# Patient Record
Sex: Female | Born: 1990 | Race: Black or African American | Hispanic: No | Marital: Single | Smoking: Never smoker
Health system: Southern US, Community
[De-identification: ages and names within clinical notes are randomized; demographics above are authoritative.]

## PROBLEM LIST (undated history)

## (undated) HISTORY — PX: HERNIA REPAIR: SHX51

## (undated) HISTORY — PX: OTHER SURGICAL HISTORY: SHX169

---
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
## 2010-02-10 ENCOUNTER — Inpatient Hospital Stay (HOSPITAL_COMMUNITY): Admission: AD | Admit: 2010-02-10 | Discharge: 2010-02-11 | Payer: Self-pay | Admitting: Obstetrics & Gynecology

## 2010-02-12 ENCOUNTER — Ambulatory Visit: Payer: Self-pay | Admitting: Nurse Practitioner

## 2010-02-12 ENCOUNTER — Inpatient Hospital Stay (HOSPITAL_COMMUNITY): Admission: AD | Admit: 2010-02-12 | Discharge: 2010-02-12 | Payer: Self-pay | Admitting: Family Medicine

## 2010-02-15 ENCOUNTER — Ambulatory Visit: Payer: Self-pay | Admitting: Obstetrics and Gynecology

## 2010-02-15 ENCOUNTER — Inpatient Hospital Stay (HOSPITAL_COMMUNITY): Admission: AD | Admit: 2010-02-15 | Discharge: 2010-02-15 | Payer: Self-pay | Admitting: Obstetrics & Gynecology

## 2010-08-19 LAB — CBC
HCT: 30.9 % — ABNORMAL LOW (ref 36.0–46.0)
MCHC: 33.8 g/dL (ref 30.0–36.0)
MCV: 83.2 fL (ref 78.0–100.0)
MCV: 83.4 fL (ref 78.0–100.0)
Platelets: 277 10*3/uL (ref 150–400)
Platelets: 306 10*3/uL (ref 150–400)
RBC: 3.17 MIL/uL — ABNORMAL LOW (ref 3.87–5.11)
RDW: 14.1 % (ref 11.5–15.5)
RDW: 14.5 % (ref 11.5–15.5)
WBC: 13.7 10*3/uL — ABNORMAL HIGH (ref 4.0–10.5)

## 2010-08-19 LAB — HCG, QUANTITATIVE, PREGNANCY
hCG, Beta Chain, Quant, S: 5147 m[IU]/mL — ABNORMAL HIGH (ref <5)
hCG, Beta Chain, Quant, S: 979 m[IU]/mL — ABNORMAL HIGH (ref <5)

## 2010-08-19 LAB — RH IMMUNE GLOB WKUP(>/=20WKS)(NOT WOMEN'S HOSP)

## 2011-02-07 ENCOUNTER — Emergency Department (HOSPITAL_COMMUNITY)
Admission: EM | Admit: 2011-02-07 | Discharge: 2011-02-08 | Disposition: A | Discharge status: Home / Self Care | Payer: Self-pay | Attending: Emergency Medicine | Admitting: Emergency Medicine

## 2011-02-07 DIAGNOSIS — J020 Streptococcal pharyngitis: Secondary | ICD-10-CM | POA: Insufficient documentation

## 2011-02-07 DIAGNOSIS — H7290 Unspecified perforation of tympanic membrane, unspecified ear: Secondary | ICD-10-CM | POA: Insufficient documentation

## 2011-02-07 DIAGNOSIS — H9209 Otalgia, unspecified ear: Secondary | ICD-10-CM | POA: Insufficient documentation

## 2011-02-07 DIAGNOSIS — I10 Essential (primary) hypertension: Secondary | ICD-10-CM | POA: Insufficient documentation

## 2012-09-15 ENCOUNTER — Emergency Department (HOSPITAL_COMMUNITY)
Admission: EM | Admit: 2012-09-15 | Discharge: 2012-09-15 | Disposition: A | Discharge status: Home / Self Care | Payer: Self-pay | Attending: Emergency Medicine | Admitting: Emergency Medicine

## 2012-09-15 ENCOUNTER — Encounter (HOSPITAL_COMMUNITY): Payer: Self-pay | Admitting: *Deleted

## 2012-09-15 DIAGNOSIS — Z87891 Personal history of nicotine dependence: Secondary | ICD-10-CM | POA: Insufficient documentation

## 2012-09-15 DIAGNOSIS — N898 Other specified noninflammatory disorders of vagina: Secondary | ICD-10-CM | POA: Insufficient documentation

## 2012-09-15 DIAGNOSIS — Z3202 Encounter for pregnancy test, result negative: Secondary | ICD-10-CM | POA: Insufficient documentation

## 2012-09-15 DIAGNOSIS — N939 Abnormal uterine and vaginal bleeding, unspecified: Secondary | ICD-10-CM

## 2012-09-15 DIAGNOSIS — R197 Diarrhea, unspecified: Secondary | ICD-10-CM | POA: Insufficient documentation

## 2012-09-15 LAB — CBC WITH DIFFERENTIAL/PLATELET
Basophils Absolute: 0 10*3/uL (ref 0.0–0.1)
Basophils Relative: 1 % (ref 0–1)
Eosinophils Relative: 2 % (ref 0–5)
HCT: 36.2 % (ref 36.0–46.0)
MCHC: 32.9 g/dL (ref 30.0–36.0)
Monocytes Absolute: 0.6 10*3/uL (ref 0.1–1.0)
Neutro Abs: 2.1 10*3/uL (ref 1.7–7.7)
Platelets: 305 10*3/uL (ref 150–400)
RDW: 14 % (ref 11.5–15.5)

## 2012-09-15 LAB — BASIC METABOLIC PANEL
Calcium: 9.3 mg/dL (ref 8.4–10.5)
Chloride: 103 mEq/L (ref 96–112)
Creatinine, Ser: 0.83 mg/dL (ref 0.50–1.10)
GFR calc Af Amer: >90 mL/min (ref >90)
Sodium: 139 mEq/L (ref 135–145)

## 2012-09-15 LAB — URINALYSIS, ROUTINE W REFLEX MICROSCOPIC
Nitrite: NEGATIVE
Protein, ur: 30 mg/dL — AB
Urobilinogen, UA: 1 mg/dL (ref 0.0–1.0)

## 2012-09-15 LAB — PREGNANCY, URINE: Preg Test, Ur: NEGATIVE

## 2012-09-15 LAB — URINE MICROSCOPIC-ADD ON

## 2012-09-15 MED ORDER — ACETAMINOPHEN 325 MG PO TABS: 650.0000 mg | ORAL_TABLET | Freq: Once | ORAL | Status: DC

## 2012-09-15 MED ORDER — MEDROXYPROGESTERONE ACETATE 150 MG/ML IM SUSP
150.0000 mg | Freq: Once | INTRAMUSCULAR | Status: AC
  Administered 2012-09-15: 150 mg via INTRAMUSCULAR
  Filled 2012-09-15: qty 1

## 2012-09-15 MED ORDER — MEGESTROL ACETATE 40 MG PO TABS: 40.0000 mg | ORAL_TABLET | Freq: Two times a day (BID) | ORAL | Status: DC

## 2012-09-15 NOTE — ED Provider Notes (Signed)
Medical screening examination/treatment/procedure(s) were performed by non-physician practitioner and as supervising physician I was immediately available for consultation/collaboration.   Loren Racer, MD 09/15/12 1447

## 2012-09-15 NOTE — ED Provider Notes (Signed)
History     CSN: [id number]  Arrival date & time 09/15/12  1256   First MD Initiated Contact with Patient 09/15/12 1303      Chief Complaint  Patient presents with  . Vaginal Bleeding    (Consider location/radiation/quality/duration/timing/severity/associated sxs/prior treatment) HPI Comments: Pt states that she has been bleeding continuously for the last 5 months:pt states that she is using about 5 ppd:pt states that she has not been seen:pt states that she is here today because she is week:pt denies fever, vomiting:pt states that she has also had diarrhea for the last couple of weeks:pt denies any previous history of irregular bleeding:pt denies abdominal pain  The history is provided by the patient. No language interpreter was used.    No past medical history on file.  Past Surgical History  Procedure Laterality Date  . Miscariage    . Hernia repair      No family history on file.  History  Substance Use Topics  . Smoking status: Former Smoker    Quit date: 09/15/2012  . Smokeless tobacco: Not on file  . Alcohol Use: Yes    OB History   Grav Para Term Preterm Abortions TAB SAB Ect Mult Living                  Review of Systems  Constitutional: Negative.   Respiratory: Negative.   Cardiovascular: Negative.     Allergies  Review of patient's allergies indicates not on file.  Home Medications  No current outpatient prescriptions on file.  BP 139/68  Pulse 92  Temp(Src) 98.8 F (37.1 C) (Oral)  Resp 20  SpO2 100%  LMP 09/15/2012  Physical Exam  Nursing note and vitals reviewed. Constitutional: She is oriented to person, place, and time. She appears well-developed and well-nourished.  obese  HENT:  Head: Normocephalic and atraumatic.  Cardiovascular: Normal rate and regular rhythm.   Pulmonary/Chest: Effort normal and breath sounds normal.  Abdominal: Soft. Bowel sounds are normal.  Genitourinary:  Vaginal bleeding:-cmt  Neurological: She is  alert and oriented to person, place, and time.  Skin: Skin is warm and dry.  Psychiatric: She has a normal mood and affect.    ED Course  Procedures (including critical care time)  Labs Reviewed  WET PREP, GENITAL - Abnormal; Notable for the following:    WBC, Wet Prep HPF POC FEW (*)    All other components within normal limits  URINALYSIS, ROUTINE W REFLEX MICROSCOPIC - Abnormal; Notable for the following:    Color, Urine AMBER (*)    APPearance CLOUDY (*)    Specific Gravity, Urine 1.033 (*)    Hgb urine dipstick LARGE (*)    Protein, ur 30 (*)    All other components within normal limits  CBC WITH DIFFERENTIAL - Abnormal; Notable for the following:    Hemoglobin 11.9 (*)    Monocytes Relative 13 (*)    All other components within normal limits  URINE MICROSCOPIC-ADD ON - Abnormal; Notable for the following:    Bacteria, UA MANY (*)    All other components within normal limits  GC/CHLAMYDIA PROBE AMP  PREGNANCY, URINE  BASIC METABOLIC PANEL   No results found.   1. Abnormal vaginal bleeding       MDM  Spoke with Dr. Jacalyn Lefevre with ob teaching service and she recommended depo and megace and they she will arrange follow up for the pt:pt vitals are stable and hgb is 11.9  Teressa Lower, NP 09/15/12 1446

## 2012-09-15 NOTE — ED Notes (Signed)
Pt presents to ED with c/o vaginal bleeding constantly since December 2013. Pt sts sometimes bleeding will slow down but it never completely stop in last 5 months. Pt does not have PCP or OBGYN.

## 2012-09-17 LAB — GC/CHLAMYDIA PROBE AMP: CT Probe RNA: NEGATIVE

## 2012-10-19 ENCOUNTER — Emergency Department (HOSPITAL_COMMUNITY): Payer: Self-pay

## 2012-10-19 ENCOUNTER — Encounter (HOSPITAL_COMMUNITY): Payer: Self-pay | Admitting: Emergency Medicine

## 2012-10-19 ENCOUNTER — Emergency Department (HOSPITAL_COMMUNITY)
Admission: EM | Admit: 2012-10-19 | Discharge: 2012-10-19 | Disposition: A | Discharge status: Home / Self Care | Payer: Self-pay | Attending: Emergency Medicine | Admitting: Emergency Medicine

## 2012-10-19 DIAGNOSIS — Z87891 Personal history of nicotine dependence: Secondary | ICD-10-CM | POA: Insufficient documentation

## 2012-10-19 DIAGNOSIS — Z3202 Encounter for pregnancy test, result negative: Secondary | ICD-10-CM | POA: Insufficient documentation

## 2012-10-19 DIAGNOSIS — N8000 Endometriosis of the uterus, unspecified: Secondary | ICD-10-CM | POA: Insufficient documentation

## 2012-10-19 DIAGNOSIS — Z880 Allergy status to penicillin: Secondary | ICD-10-CM | POA: Insufficient documentation

## 2012-10-19 LAB — WET PREP, GENITAL
Trich, Wet Prep: NONE SEEN
Yeast Wet Prep HPF POC: NONE SEEN

## 2012-10-19 LAB — POCT I-STAT, CHEM 8
Creatinine, Ser: 0.8 mg/dL (ref 0.50–1.10)
Hemoglobin: 12.9 g/dL (ref 12.0–15.0)
Sodium: 143 mEq/L (ref 135–145)
TCO2: 25 mmol/L (ref 0–100)

## 2012-10-19 LAB — URINALYSIS, ROUTINE W REFLEX MICROSCOPIC
Leukocytes, UA: NEGATIVE
Nitrite: NEGATIVE
Protein, ur: NEGATIVE mg/dL
Urobilinogen, UA: 1 mg/dL (ref 0.0–1.0)

## 2012-10-19 LAB — URINE MICROSCOPIC-ADD ON

## 2012-10-19 MED ORDER — OXYCODONE-ACETAMINOPHEN 5-325 MG PO TABS: 1.0000 | ORAL_TABLET | ORAL | Status: DC | PRN

## 2012-10-19 NOTE — ED Notes (Signed)
Pt states she has been bleeding from vaginal area for two weeks and it is continuously getting worse and also having abdominal cramping,  Pt states she was seen previously for this but did not follow up as instructed,  Pt is alert and oriented in NAD

## 2012-10-19 NOTE — ED Notes (Signed)
Pt c/o vaginal bleeding and cramps onset 10/08/12. Pt tx here for same last month. Has not followed up with GYN.

## 2012-10-19 NOTE — ED Provider Notes (Signed)
History     CSN: [id number]  Arrival date & time 10/19/12  1951   First MD Initiated Contact with Patient 10/19/12 2015      Chief Complaint  Patient presents with  . Dysmenorrhea    (Consider location/radiation/quality/duration/timing/severity/associated sxs/prior treatment) HPI History provided by pt and prior chart.  Per prior chart, pt presented to ED w/ 6 months vaginal bleeding 09/15/12.  Hgb 11.9 at that time.  OB resident consulted and recommended IM depo and 5d course of megace.  Pt reports compliance with this medication and resolution of sx w/in 3 days.  Approx 1 week ago, the vaginal bleeding returned and is now associated w/ constant, severe, diffuse pelvic pain, worst in L suprapubic.  Reports that the bleeding is heavy; goes through 3 doubled-up pads/day.  Alleviated by having a BM.  No associated fever, N/V/D, hematemesis/hematochezia/melena or other GU sx.  Denies lightheadedness, syncope and SOB as well.   History reviewed. No pertinent past medical history.  Past Surgical History  Procedure Laterality Date  . Miscariage    . Hernia repair      No family history on file.  History  Substance Use Topics  . Smoking status: Former Smoker    Quit date: 09/15/2012  . Smokeless tobacco: Not on file  . Alcohol Use: No    OB History   Grav Para Term Preterm Abortions TAB SAB Ect Mult Living                  Review of Systems  All other systems reviewed and are negative.    Allergies  Penicillins  Home Medications   Current Outpatient Rx  Name  Route  Sig  Dispense  Refill  . ibuprofen (ADVIL,MOTRIN) 200 MG tablet   Oral   Take 400 mg by mouth every 6 (six) hours as needed for pain.           Ht 5\' 5"  (1.651 m)  Wt 350 lb (158.759 kg)  BMI 58.24 kg/m2  LMP 10/08/2012  Physical Exam  Nursing note and vitals reviewed. Constitutional: She is oriented to person, place, and time. She appears well-developed and well-nourished. No distress.   Morbidly obese  HENT:  Head: Normocephalic and atraumatic.  Eyes:  Normal appearance  Neck: Normal range of motion.  Cardiovascular: Normal rate and regular rhythm.   Pulmonary/Chest: Effort normal and breath sounds normal. No respiratory distress.  Abdominal: Soft. Bowel sounds are normal. She exhibits no distension and no mass. There is no tenderness. There is no rebound and no guarding.  Genitourinary:  No CVA tenderness.  Nml external genitalia.  Large clot in vaginal vault w/ moderate bleeding.  Cervix closed and appears nml.  Diffuse tenderness on bimanual.   Musculoskeletal: Normal range of motion.  Neurological: She is alert and oriented to person, place, and time.  Skin: Skin is warm and dry. No rash noted.  Psychiatric: She has a normal mood and affect. Her behavior is normal.    ED Course  Procedures (including critical care time)  Labs Reviewed  WET PREP, GENITAL - Abnormal; Notable for the following:    Clue Cells Wet Prep HPF POC FEW (*)    WBC, Wet Prep HPF POC FEW (*)    All other components within normal limits  URINALYSIS, ROUTINE W REFLEX MICROSCOPIC - Abnormal; Notable for the following:    Specific Gravity, Urine 1.032 (*)    Hgb urine dipstick LARGE (*)    All other components within  normal limits  POCT I-STAT, CHEM 8 - Abnormal; Notable for the following:    Calcium, Ion 1.26 (*)    All other components within normal limits  GC/CHLAMYDIA PROBE AMP  URINE MICROSCOPIC-ADD ON  RPR  POCT PREGNANCY, URINE   US Transvaginal Non-ob  10/19/2012   *RADIOLOGY REPORT*  Clinical Data: Dysmenorrhea and menorrhagia  TRANSABDOMINAL AND TRANSVAGINAL ULTRASOUND OF PELVIS Technique:  Both transabdominal and transvaginal ultrasound examinations of the pelvis were performed. Transabdominal technique was performed for global imaging of the pelvis including uterus, ovaries, adnexal regions, and pelvic cul-de-sac.  It was necessary to proceed with endovaginal exam following  the transabdominal exam to visualize the uterus, endometrium, ovaries, and adnexa.  Comparison:  None  Findings:  This exam is technically challenging secondary to patient body habitus.  Uterus: The uterus is anteverted and measures 8.2 x 5.7 x 5.5 cm. The uterus is diffusely heterogeneous.  There are scattered linear areas of shadowing. The junctional zone between the endometrium and myometrium is indistinct. Uterine adenomyosis is suspected.  A discrete focal uterine mass is not definitely seen.  Endometrium: The margins of the endometrium are not discretely delineated.  Endometrial thickness is approximately 10 mm.  Right ovary:  Normal appearance/no adnexal mass.  Left ovary: Normal appearance/no adnexal mass.  Other findings: No free fluid  IMPRESSION:  1. Sonographic findings suggestive of uterine adenomyosis.  The myometrium is diffusely heterogeneous and the junctional zone between the myometrium and endometrium is very indistinct.  No definite discrete focal uterine mass is identified. 2. Endometrial thickness is approximately 10 mm, but evaluation is limited due to the indistinctness of the margins of the endometrium. 3.  Normal ovaries.   Original Report Authenticated By: Britta Mccreedy, M.D.   US Pelvis Complete  10/19/2012   *RADIOLOGY REPORT*  Clinical Data: Dysmenorrhea and menorrhagia  TRANSABDOMINAL AND TRANSVAGINAL ULTRASOUND OF PELVIS Technique:  Both transabdominal and transvaginal ultrasound examinations of the pelvis were performed. Transabdominal technique was performed for global imaging of the pelvis including uterus, ovaries, adnexal regions, and pelvic cul-de-sac.  It was necessary to proceed with endovaginal exam following the transabdominal exam to visualize the uterus, endometrium, ovaries, and adnexa.  Comparison:  None  Findings:  This exam is technically challenging secondary to patient body habitus.  Uterus: The uterus is anteverted and measures 8.2 x 5.7 x 5.5 cm. The uterus is  diffusely heterogeneous.  There are scattered linear areas of shadowing. The junctional zone between the endometrium and myometrium is indistinct. Uterine adenomyosis is suspected.  A discrete focal uterine mass is not definitely seen.  Endometrium: The margins of the endometrium are not discretely delineated.  Endometrial thickness is approximately 10 mm.  Right ovary:  Normal appearance/no adnexal mass.  Left ovary: Normal appearance/no adnexal mass.  Other findings: No free fluid  IMPRESSION:  1. Sonographic findings suggestive of uterine adenomyosis.  The myometrium is diffusely heterogeneous and the junctional zone between the myometrium and endometrium is very indistinct.  No definite discrete focal uterine mass is identified. 2. Endometrial thickness is approximately 10 mm, but evaluation is limited due to the indistinctness of the margins of the endometrium. 3.  Normal ovaries.   Original Report Authenticated By: Britta Mccreedy, M.D.     1. Adenomyosis       MDM  21yo F presents for the second time w/ menorrhagia.  Received IM depo and 5d course of megace on 09/15/12 and sx resolved, but then returned 2 weeks ago.  Now associated w/  pelvic pain.  Suspect uterine fibroids.  Transvaginal US ordered because this is patient's second visit for same and she has no f/u at this time.  Hct/hgb pending as well; pt not experiencing any sx suggestive of anemia.  8:46 PM   US shows adenomyosis.  Hgb w/in nml range and labs otherwise unremarkable.   Discussed w/ patient.  Recommended close f/u with Gyn, prescribed percocet for pain and Strict return precautions discussed.  She is aware that [facility] an [facility]. 10:35 PM         Otilio Miu, PA-C 10/19/12 2235

## 2012-10-20 LAB — GC/CHLAMYDIA PROBE AMP: CT Probe RNA: NEGATIVE

## 2012-10-20 NOTE — ED Provider Notes (Signed)
Medical screening examination/treatment/procedure(s) were performed by non-physician practitioner and as supervising physician I was immediately available for consultation/collaboration.  Doug Sou, MD 10/20/12 [id number]

## 2012-11-09 ENCOUNTER — Encounter: Payer: Self-pay | Admitting: Obstetrics & Gynecology

## 2012-11-09 ENCOUNTER — Ambulatory Visit (INDEPENDENT_AMBULATORY_CARE_PROVIDER_SITE_OTHER): Payer: Self-pay | Admitting: Obstetrics & Gynecology

## 2012-11-09 VITALS — BP 154/95 | HR 101 | Temp 98.3°F | Ht 65.0 in | Wt 341.8 lb

## 2012-11-09 DIAGNOSIS — E282 Polycystic ovarian syndrome: Secondary | ICD-10-CM

## 2012-11-09 DIAGNOSIS — N970 Female infertility associated with anovulation: Secondary | ICD-10-CM | POA: Insufficient documentation

## 2012-11-09 LAB — LIPID PANEL
Cholesterol: 182 mg/dL (ref 0–200)
Total CHOL/HDL Ratio: 4.9 Ratio
Triglycerides: 157 mg/dL — ABNORMAL HIGH (ref <150)
VLDL: 31 mg/dL (ref 0–40)

## 2012-11-09 MED ORDER — NORGESTIMATE-ETH ESTRADIOL 0.25-35 MG-MCG PO TABS: 1.0000 | ORAL_TABLET | Freq: Every day | ORAL | Status: DC

## 2012-11-09 NOTE — Patient Instructions (Addendum)
Polycystic Ovarian Syndrome  Polycystic ovarian syndrome is a condition with a number of problems. One problem is with the ovaries. The ovaries are organs located in the female pelvis, on each side of the uterus. Usually, during the menstrual cycle, an egg is released from 1 ovary every month. This is called ovulation. When the egg is fertilized, it goes into the womb (uterus), which allows for the growth of a baby. The egg travels from the ovary through the fallopian tube to the uterus. The ovaries also make the hormones estrogen and progesterone. These hormones help the development of a woman's breasts, body shape, and body hair. They also regulate the menstrual cycle and pregnancy.  Sometimes, cysts form in the ovaries. A cyst is a fluid-filled sac. On the ovary, different types of cysts can form. The most common type of ovarian cyst is called a functional or ovulation cyst. It is normal, and often forms during the normal menstrual cycle. Each month, a woman's ovaries grow tiny cysts that hold the eggs. When an egg is fully grown, the sac breaks open. This releases the egg. Then, the sac which released the egg from the ovary dissolves. In one type of functional cyst, called a follicle cyst, the sac does not break open to release the egg. It may actually continue to grow. This type of cyst usually disappears within 1 to 3 months.   One type of cyst problem with the ovaries is called Polycystic Ovarian Syndrome (PCOS). In this condition, many follicle cysts form, but do not rupture and produce an egg. This health problem can affect the following:  · Menstrual cycle.  · Heart.  · Obesity.  · Cancer of the uterus.  · Fertility.  · Blood vessels.  · Hair growth (face and body) or baldness.  · Hormones.  · Appearance.  · High blood pressure.  · Stroke.  · Insulin production.  · Inflammation of the liver.  · Elevated blood cholesterol and triglycerides.  CAUSES   · No one knows the exact cause of PCOS.  · Women with  PCOS often have a mother or sister with PCOS. There is not yet enough proof to say this is inherited.  · Many women with PCOS have a weight problem.  · Researchers are looking at the relationship between PCOS and the body's ability to make insulin. Insulin is a hormone that regulates the change of sugar, starches, and other food into energy for the body's use, or for storage. Some women with PCOS make too much insulin. It is possible that the ovaries react by making too many female hormones, called androgens. This can lead to acne, excessive hair growth, weight gain, and ovulation problems.  · Too much production of luteinizing hormone (LH) from the pituitary gland in the brain stimulates the ovary to produce too much female hormone (androgen).  SYMPTOMS   · Infrequent or no menstrual periods, and/or irregular bleeding.  · Inability to get pregnant (infertility), because of not ovulating.  · Increased growth of hair on the face, chest, stomach, back, thumbs, thighs, or toes.  · Acne, oily skin, or dandruff.  · Pelvic pain.  · Weight gain or obesity, usually carrying extra weight around the waist.  · Type 2 diabetes (this is the diabetes that usually does not need insulin).  · High cholesterol.  · High blood pressure.  · Female-pattern baldness or thinning hair.  · Patches of thickened and dark brown or black skin on the neck, arms, breasts,   or thighs.  · Skin tags, or tiny excess flaps of skin, in the armpits or neck area.  · Sleep apnea (excessive snoring and breathing stops at times while asleep).  · Deepening of the voice.  · Gestational diabetes when pregnant.  · Increased risk of miscarriage with pregnancy.  DIAGNOSIS   There is no single test to diagnose PCOS.   · Your caregiver will:  · Take a medical history.  · Perform a pelvic exam.  · Perform an ultrasound.  · Check your female and female hormone levels.  · Measure glucose or sugar levels in the blood.  · Do other blood tests.  · If you are producing too many  female hormones, your caregiver will make sure it is from PCOS. At the physical exam, your caregiver will want to evaluate the areas of increased hair growth. Try to allow natural hair growth for a few days before the visit.  · During a pelvic exam, the ovaries may be enlarged or swollen by the increased number of small cysts. This can be seen more easily by vaginal ultrasound or screening, to examine the ovaries and lining of the uterus (endometrium) for cysts. The uterine lining may become thicker, if there has not been a regular period.  TREATMENT   Because there is no cure for PCOS, it needs to be managed to prevent problems. Treatments are based on your symptoms. Treatment is also based on whether you want to have a baby or whether you need contraception.   Treatment may include:  · Progesterone hormone, to start a menstrual period.  · Birth control pills, to make you have regular menstrual periods.  · Medicines to make you ovulate, if you want to get pregnant.  · Medicines to control your insulin.  · Medicine to control your blood pressure.  · Medicine and diet, to control your high cholesterol and triglycerides in your blood.  · Surgery, making small holes in the ovary, to decrease the amount of female hormone production. This is done through a long, lighted tube (laparoscope), placed into the pelvis through a tiny incision in the lower abdomen.  Your caregiver will go over some of the choices with you.  WOMEN WITH PCOS HAVE THESE CHARACTERISTICS:  · High levels of female hormones called androgens.  · An irregular or no menstrual cycle.  · May have many small cysts in their ovaries.  PCOS is the most common hormonal reproductive problem in women of childbearing age.  WHY DO WOMEN WITH PCOS HAVE TROUBLE WITH THEIR MENSTRUAL CYCLE?  Each month, about 20 eggs start to mature in the ovaries. As one egg grows and matures, the follicle breaks open to release the egg, so it can travel through the fallopian tube for  fertilization. When the single egg leaves the follicle, ovulation takes place. In women with PCOS, the ovary does not make all of the hormones it needs for any of the eggs to fully mature. They may start to grow and accumulate fluid, but no one egg becomes large enough. Instead, some may remain as cysts. Since no egg matures or is released, ovulation does not occur and the hormone progesterone is not made. Without progesterone, a woman's menstrual cycle is irregular or absent. Also, the cysts produce female hormones, which continue to prevent ovulation.   Document Released: 09/16/2004 Document Revised: 08/15/2011 Document Reviewed: 04/10/2009  [employer]® Patient Information ©2014 [employer].

## 2012-11-09 NOTE — Progress Notes (Signed)
Subjective:     Patient ID: Georgiana Spinner [place], female   DOB: 1991/03/22, 22 y.o.   MRN: [id number]  HPI Pt reports that he cycles initially became abnormal in Nov 2013 following an SAB.  She does reports weight changes at that time.  She is currently on Depo Provera with continued bleeding.  She has pain assoc with the bleeding.    Review of Systems     Objective:   Physical Exam BP 154/95  Pulse 101  Temp(Src) 98.3 F (36.8 C) (Oral)  Ht 5\' 5"  (1.651 m)  Wt 341 lb 12.8 oz (155.039 kg)  BMI 56.88 kg/m2  LMP 10/08/2012 GYN exam deferred pt still bleeding (exam done in ED) HEENT: acanthosis nigricans  10/19/2012 Findings:  This exam is technically challenging secondary to patient body  habitus.  Uterus: The uterus is anteverted and measures 8.2 x 5.7 x 5.5 cm.  The uterus is diffusely heterogeneous. There are scattered linear  areas of shadowing. The junctional zone between the endometrium and  myometrium is indistinct. Uterine adenomyosis is suspected. A  discrete focal uterine mass is not definitely seen.  Endometrium: The margins of the endometrium are not discretely  delineated. Endometrial thickness is approximately 10 mm.  Right ovary: Normal appearance/no adnexal mass.  Left ovary: Normal appearance/no adnexal mass.  Other findings: No free fluid  IMPRESSION:  1. Sonographic findings suggestive of uterine adenomyosis. The  myometrium is diffusely heterogeneous and the junctional zone  between the myometrium and endometrium is very indistinct. No  definite discrete focal uterine mass is identified.  2. Endometrial thickness is approximately 10 mm, but evaluation is  limited due to the indistinctness of the margins of the  endometrium.  3. Normal ovaries.       Assessment:     Anovulatory uterine bleeding- suspect PCOS Elevated BP      Plan:     LAB: HbA1c, lipid panel, TSH F/u 3 months  sprintec 1 po q day F/u BP next visit

## 2012-11-15 ENCOUNTER — Telehealth: Payer: Self-pay | Admitting: *Deleted

## 2012-11-15 NOTE — Telephone Encounter (Signed)
Pt left message stating that she was in our office on 6/6, saw Dr. Erin Fulling and was prescribed Sprintec. She has been having nausea, vomiting and headaches. She requests to have an alternate medication.

## 2012-11-21 NOTE — Telephone Encounter (Signed)
Patient called and left message stating she saw Dr Erin Fulling on 6/6 and was given a Rx for sprintec which gave her uncontrolled nausea, vomiting and headaches and called last week leaving a message requesting a new medication and since then has stopped the sprintec because she was so sick she couldn't leave her house and now that she is off the sprintec her symptoms have came back worse and her bleeding is heavier and the pain is intense and she would like a call back so that she can get a new medicine

## 2012-11-22 NOTE — Telephone Encounter (Signed)
Called patient, phone went straight to voicemail- left message that we are returning her call and to call us back at the clinics

## 2012-11-23 NOTE — Telephone Encounter (Signed)
Spoke to patient and advised per Dr. Dolan Amen to stop Sprintec. She still have Depo provera in her system and had just started with this for bleeding 09/15/12. Advised to stay on Depo and allow for her body to adjust respond to it. Patient advised that if her bleeding increases where in she is changing pad >3X/ hour; she is to go to [facility]. Patient states understanding and satisfied.

## 2012-12-12 ENCOUNTER — Ambulatory Visit: Payer: Self-pay

## 2012-12-14 ENCOUNTER — Encounter (HOSPITAL_COMMUNITY): Payer: Self-pay | Admitting: Emergency Medicine

## 2012-12-14 ENCOUNTER — Emergency Department (HOSPITAL_COMMUNITY)
Admission: EM | Admit: 2012-12-14 | Discharge: 2012-12-14 | Disposition: A | Discharge status: Home / Self Care | Payer: Self-pay | Attending: Emergency Medicine | Admitting: Emergency Medicine

## 2012-12-14 ENCOUNTER — Ambulatory Visit (INDEPENDENT_AMBULATORY_CARE_PROVIDER_SITE_OTHER): Payer: Self-pay

## 2012-12-14 VITALS — BP 154/102 | HR 90 | Ht 65.0 in | Wt 338.2 lb

## 2012-12-14 DIAGNOSIS — Z3049 Encounter for surveillance of other contraceptives: Secondary | ICD-10-CM

## 2012-12-14 DIAGNOSIS — R002 Palpitations: Secondary | ICD-10-CM | POA: Insufficient documentation

## 2012-12-14 DIAGNOSIS — R030 Elevated blood-pressure reading, without diagnosis of hypertension: Secondary | ICD-10-CM | POA: Insufficient documentation

## 2012-12-14 DIAGNOSIS — R51 Headache: Secondary | ICD-10-CM | POA: Insufficient documentation

## 2012-12-14 DIAGNOSIS — IMO0001 Reserved for inherently not codable concepts without codable children: Secondary | ICD-10-CM

## 2012-12-14 DIAGNOSIS — Z880 Allergy status to penicillin: Secondary | ICD-10-CM | POA: Insufficient documentation

## 2012-12-14 DIAGNOSIS — Z87891 Personal history of nicotine dependence: Secondary | ICD-10-CM | POA: Insufficient documentation

## 2012-12-14 LAB — POCT I-STAT, CHEM 8
BUN: 15 mg/dL (ref 6–23)
Calcium, Ion: 1.14 mmol/L (ref 1.12–1.23)
Chloride: 105 meq/L (ref 96–112)
Creatinine, Ser: 0.9 mg/dL (ref 0.50–1.10)
Glucose, Bld: 85 mg/dL (ref 70–99)
HCT: 34 % — ABNORMAL LOW (ref 36.0–46.0)
Hemoglobin: 11.6 g/dL — ABNORMAL LOW (ref 12.0–15.0)
Potassium: 4.2 meq/L (ref 3.5–5.1)
Sodium: 140 meq/L (ref 135–145)
TCO2: 23 mmol/L (ref 0–100)

## 2012-12-14 MED ORDER — MEDROXYPROGESTERONE ACETATE 150 MG/ML IM SUSP
150.0000 mg | Freq: Once | INTRAMUSCULAR | Status: AC
  Administered 2012-12-14: 150 mg via INTRAMUSCULAR

## 2012-12-14 NOTE — Progress Notes (Signed)
Pt came in for Depo Provera checking vitals pt BP elevated.  Rechecked BP, BP continues to be elevated.  Per Wynelle Bourgeois, pt can still have receive her Depo Provera injection today.  I advised pt to please go to urgent care center for BP to be evaluated.  Educated pt on elevated BP's and gave pt #'s to [facility], [facility], and [facility] @ [facility].  Pt stated that she would be going to Urgent Care now when leave appt.

## 2012-12-14 NOTE — ED Notes (Signed)
[facility] for BP of 154/102.  Pt in no acute distress.

## 2012-12-14 NOTE — ED Provider Notes (Signed)
History    CSN: [id number] Arrival date & time 12/14/12  1021  First MD Initiated Contact with Patient 12/14/12 1132     Chief Complaint  Patient presents with  . Hypertension   (Consider location/radiation/quality/duration/timing/severity/associated sxs/prior Treatment) HPI Pt is a 21yo female sent to ED from [facility] after 2 readings of elevated BP.   First reading was June 6th, second was today 7/11 at [facility] during routine health check and Depo shot.  Reports occasional palpitations and headaches but none at this time. No other meds besides depo shot. Pt reports hx of PCOS.  Never followed up with PCP in June like she was advised by [facility] clinic.   History reviewed. No pertinent past medical history. Past Surgical History  Procedure Laterality Date  . Miscariage    . Hernia repair     History reviewed. No pertinent family history. History  Substance Use Topics  . Smoking status: Former Smoker    Quit date: 09/15/2012  . Smokeless tobacco: Not on file  . Alcohol Use: No   OB History   Grav Para Term Preterm Abortions TAB SAB Ect Mult Living                 Review of Systems  Constitutional: Negative for fever, chills and fatigue.  Cardiovascular: Positive for palpitations. Negative for chest pain.  Gastrointestinal: Negative for nausea.  Neurological: Positive for headaches ( occasional, none now). Negative for dizziness and light-headedness.  All other systems reviewed and are negative.    Allergies  Penicillins  Home Medications   Current Outpatient Rx  Name  Route  Sig  Dispense  Refill  . ibuprofen (ADVIL,MOTRIN) 200 MG tablet   Oral   Take 400 mg by mouth every 6 (six) hours as needed for pain.         . medroxyPROGESTERone (DEPO-PROVERA) 150 MG/ML injection   Intramuscular   Inject 150 mg into the muscle every 3 (three) months.          BP 122/70  Pulse 87  Temp(Src) 98.9 F (37.2 C) (Oral)  Resp 18  SpO2 100% Physical Exam   Nursing note and vitals reviewed. Constitutional: She appears well-developed and well-nourished. No distress.  HENT:  Head: Normocephalic and atraumatic.  Eyes: Conjunctivae are normal. No scleral icterus.  Neck: Normal range of motion.  Cardiovascular: Normal rate, regular rhythm and normal heart sounds.   Pulmonary/Chest: Effort normal and breath sounds normal. No respiratory distress. She has no wheezes. She has no rales. She exhibits no tenderness.  Abdominal: Soft. Bowel sounds are normal. She exhibits no distension and no mass. There is no tenderness. There is no rebound and no guarding.  Musculoskeletal: Normal range of motion.  Neurological: She is alert.  Skin: Skin is warm and dry. She is not diaphoretic.    ED Course  Procedures (including critical care time) Labs Reviewed  POCT I-STAT, CHEM 8 - Abnormal; Notable for the following:    Hemoglobin 11.6 (*)    HCT 34.0 (*)    All other components within normal limits   No results found. 1. Elevated BP     MDM  Pt is asymptomatic upon arrival.  BP has gradually lowered throughout pt's stay in ED.  istat chem 8: unremarkable.  Will not start pt on BP medication today as pt appears to have relatively normal BP.  Will strongly encourage pt to f/u with [facility] and [facility] info provided.  Return precautions given. Pt  verbalized understanding and agreement with tx plan. Vitals: unremarkable. Discharged in stable condition.       Junius Finner, PA-C 12/14/12 1307

## 2012-12-14 NOTE — Progress Notes (Signed)
[facility] has seen patient and provided her with a list of primary care resources. Pt stated that she was pending insurance through her job.

## 2012-12-14 NOTE — ED Provider Notes (Signed)
Medical screening examination/treatment/procedure(s) were performed by non-physician practitioner and as supervising physician I was immediately available for consultation/collaboration.   Jaylee Lantry Y. Laken Lobato, MD 12/14/12 1501 

## 2012-12-17 ENCOUNTER — Ambulatory Visit: Payer: Self-pay

## 2013-09-02 IMAGING — US US TRANSVAGINAL NON-OB
1 series · 13 of 25 positions shown · non-contrast
Comparison: None

CLINICAL DATA: Dysmenorrhea and menorrhagia

TRANSABDOMINAL AND TRANSVAGINAL ULTRASOUND OF PELVIS
TECHNIQUE: Both transabdominal and transvaginal ultrasound
examinations of the pelvis were performed. Transabdominal technique
was performed for global imaging of the pelvis including uterus,
ovaries, adnexal regions, and pelvic cul-de-sac.
It was necessary to proceed with endovaginal exam following the
transabdominal exam to visualize the uterus, endometrium, ovaries,
and adnexa.

[Series 1: us transvaginal non-ob · 0.33mm/px · 13 of 57 slices shown]
[im 1/57]
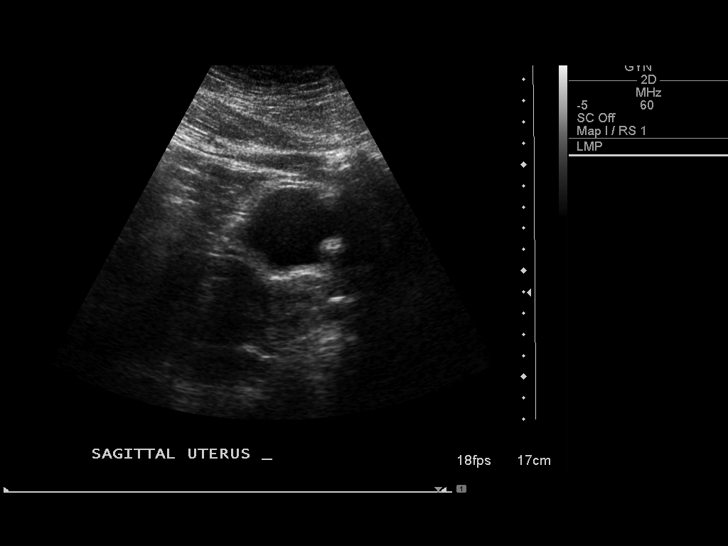
[im 5/57]
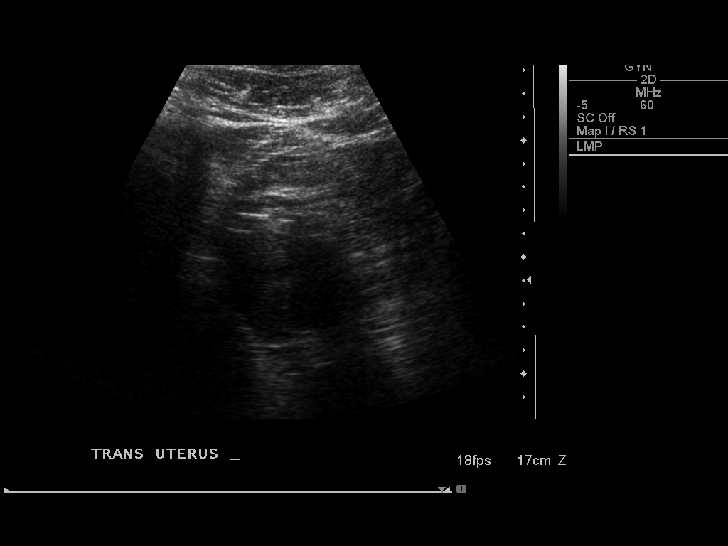
[im 10/57]
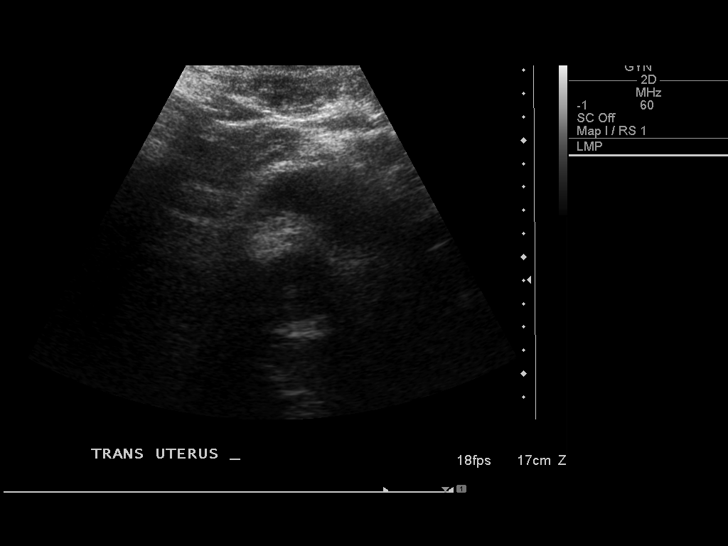
[im 15/57]
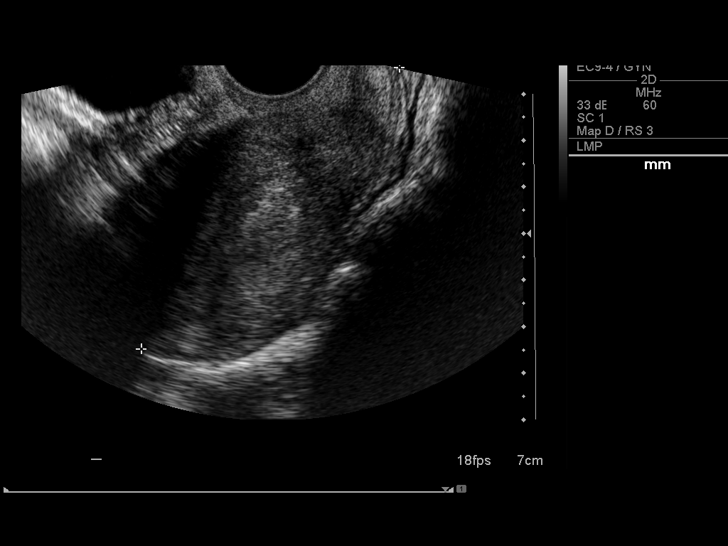
[im 19/57]
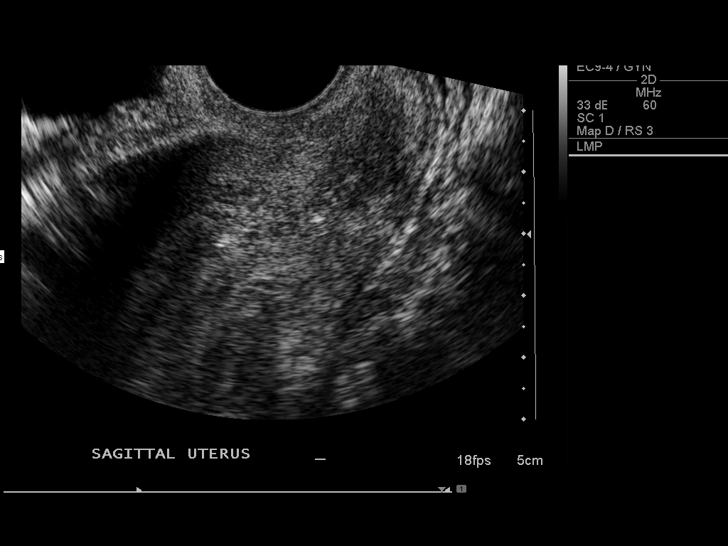
[im 24/57]
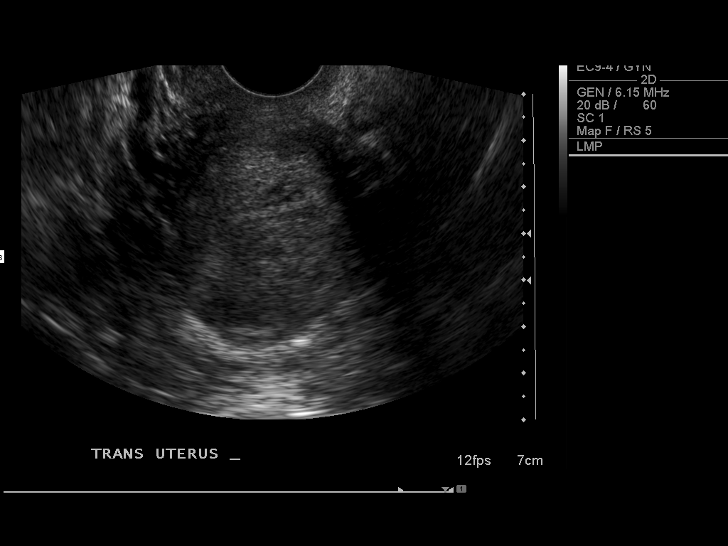
[im 29/57]
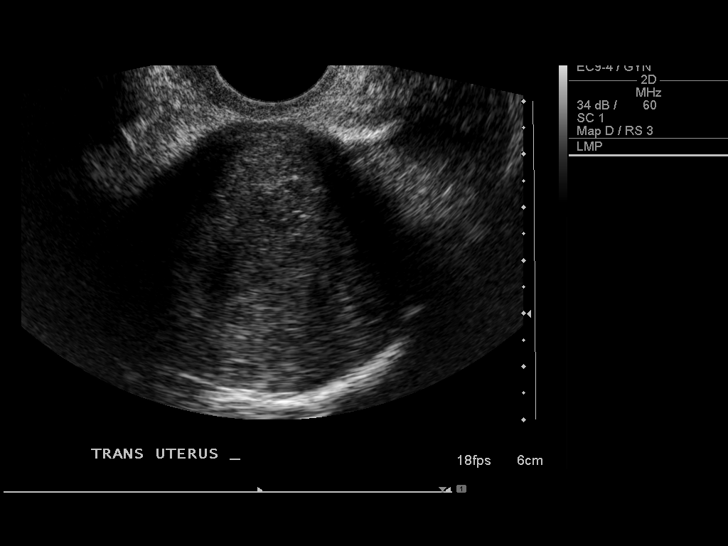
[im 33/57]
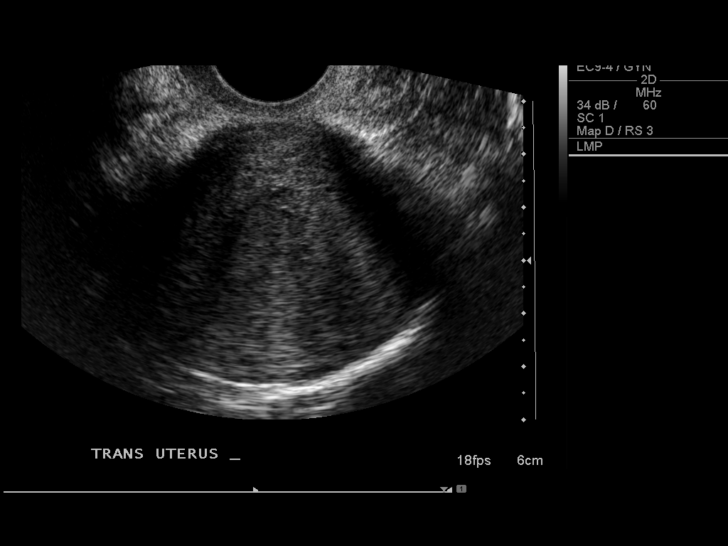
[im 38/57]
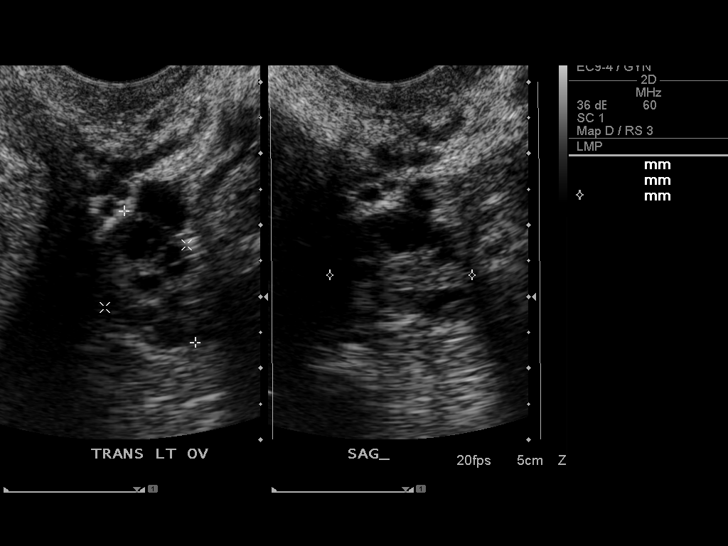
[im 43/57]
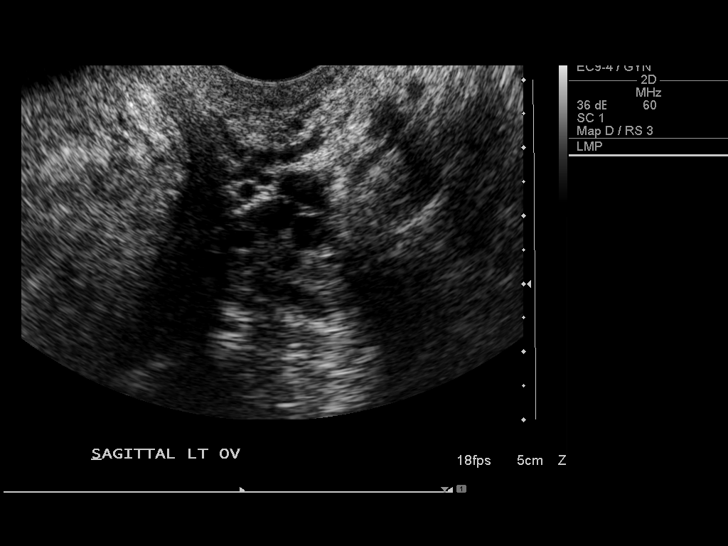
[im 47/57]
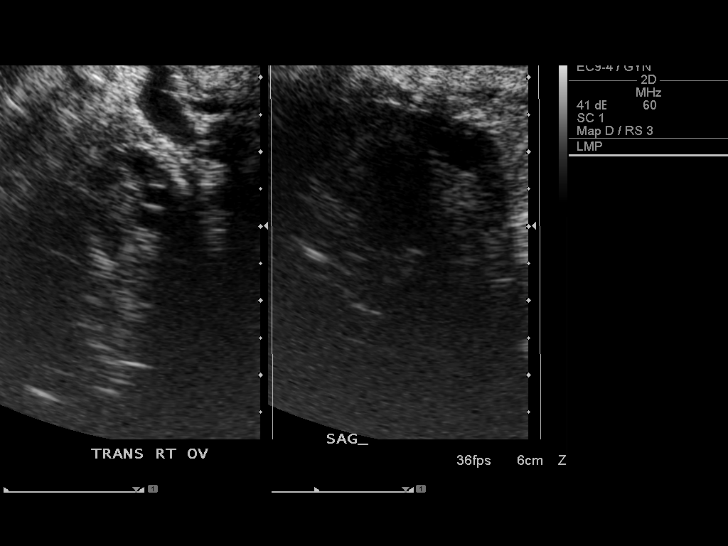
[im 52/57]
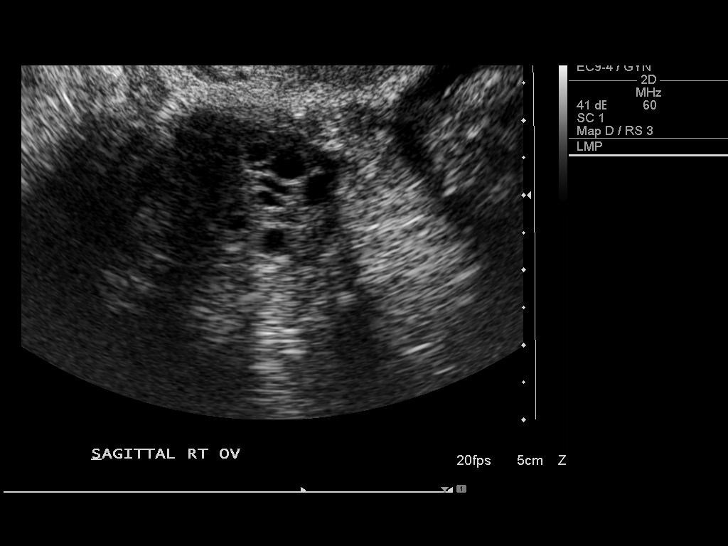
[im 57/57]
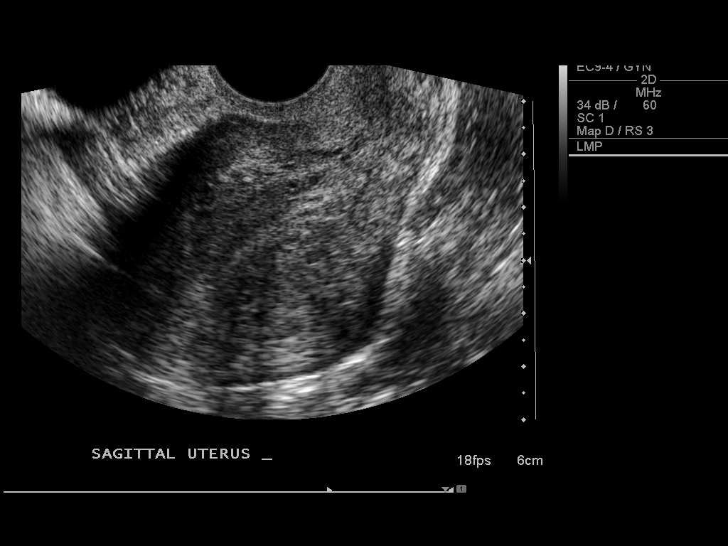

[13 of 25 positions shown; findings below may reference images not displayed]

FINDINGS: This exam is technically challenging secondary to patient body
habitus.

Uterus: The uterus is anteverted and measures 8.2 x 5.7 x 5.5 cm.
The uterus is diffusely heterogeneous.  There are scattered linear
areas of shadowing. The junctional zone between the endometrium and
myometrium is indistinct. Uterine adenomyosis is suspected.  A
discrete focal uterine mass is not definitely seen.

Endometrium: The margins of the endometrium are not discretely
delineated.  Endometrial thickness is approximately 10 mm.

Right ovary:  Normal appearance/no adnexal mass.

Left ovary: Normal appearance/no adnexal mass.

Other findings: No free fluid
IMPRESSION: 1. Sonographic findings suggestive of uterine adenomyosis.  The
myometrium is diffusely heterogeneous and the junctional zone
between the myometrium and endometrium is very indistinct.  No
definite discrete focal uterine mass is identified.
2. Endometrial thickness is approximately 10 mm, but evaluation is
limited due to the indistinctness of the margins of the
endometrium.
3.  Normal ovaries.

## 2014-10-08 ENCOUNTER — Ambulatory Visit: Payer: Self-pay | Admitting: Women's Health

## 2014-10-09 ENCOUNTER — Other Ambulatory Visit (HOSPITAL_COMMUNITY)
Admission: RE | Admit: 2014-10-09 | Discharge: 2014-10-09 | Disposition: A | Discharge status: Home / Self Care | Payer: BLUE CROSS/BLUE SHIELD | Source: Ambulatory Visit | Attending: Women's Health | Admitting: Women's Health

## 2014-10-09 ENCOUNTER — Encounter: Payer: Self-pay | Admitting: Women's Health

## 2014-10-09 ENCOUNTER — Ambulatory Visit (INDEPENDENT_AMBULATORY_CARE_PROVIDER_SITE_OTHER): Payer: BLUE CROSS/BLUE SHIELD | Admitting: Women's Health

## 2014-10-09 VITALS — BP 119/76 | Ht 65.0 in | Wt 352.2 lb

## 2014-10-09 DIAGNOSIS — Z833 Family history of diabetes mellitus: Secondary | ICD-10-CM

## 2014-10-09 DIAGNOSIS — Z01419 Encounter for gynecological examination (general) (routine) without abnormal findings: Secondary | ICD-10-CM | POA: Insufficient documentation

## 2014-10-09 DIAGNOSIS — Z6741 Type O blood, Rh negative: Secondary | ICD-10-CM | POA: Insufficient documentation

## 2014-10-09 DIAGNOSIS — N946 Dysmenorrhea, unspecified: Secondary | ICD-10-CM | POA: Diagnosis not present

## 2014-10-09 DIAGNOSIS — Z113 Encounter for screening for infections with a predominantly sexual mode of transmission: Secondary | ICD-10-CM | POA: Insufficient documentation

## 2014-10-09 LAB — CBC WITH DIFFERENTIAL/PLATELET
BASOS ABS: 0.1 10*3/uL (ref 0.0–0.1)
BASOS PCT: 1 % (ref 0–1)
Eosinophils Absolute: 0.1 10*3/uL (ref 0.0–0.7)
Eosinophils Relative: 2 % (ref 0–5)
HEMATOCRIT: 37.2 % (ref 36.0–46.0)
Hemoglobin: 11.6 g/dL — ABNORMAL LOW (ref 12.0–15.0)
LYMPHS PCT: 41 % (ref 12–46)
Lymphs Abs: 2.8 10*3/uL (ref 0.7–4.0)
MCH: 23.6 pg — ABNORMAL LOW (ref 26.0–34.0)
MCHC: 31.2 g/dL (ref 30.0–36.0)
MCV: 75.6 fL — AB (ref 78.0–100.0)
MONO ABS: 0.5 10*3/uL (ref 0.1–1.0)
MONOS PCT: 8 % (ref 3–12)
MPV: 9.9 fL (ref 8.6–12.4)
NEUTROS ABS: 3.3 10*3/uL (ref 1.7–7.7)
NEUTROS PCT: 48 % (ref 43–77)
Platelets: 363 10*3/uL (ref 150–400)
RBC: 4.92 MIL/uL (ref 3.87–5.11)
RDW: 17.9 % — ABNORMAL HIGH (ref 11.5–15.5)
WBC: 6.8 10*3/uL (ref 4.0–10.5)

## 2014-10-09 LAB — TSH: TSH: 1.061 u[IU]/mL (ref 0.350–4.500)

## 2014-10-09 LAB — GLUCOSE, RANDOM: Glucose, Bld: 80 mg/dL (ref 70–99)

## 2014-10-09 MED ORDER — IBUPROFEN 600 MG PO TABS: 600.0000 mg | ORAL_TABLET | Freq: Four times a day (QID) | ORAL | Status: AC | PRN

## 2014-10-09 NOTE — Progress Notes (Signed)
Katie Marshall 09/25/1990 [id number]    History:    Presents for annual exam.  Regular monthly cycle/condoms/ same partner years/recent negative STD screen. History of questionable PCOS. 10/2012 adenomyosis and normal ovaries noted on ultrasound. Reports having gardasil in high school. SAB 2013. History of irregular cycles 2014.  Past medical history, past surgical history, family history and social history were all reviewed and documented in the EPIC chart. 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 Mother hypertension, father hypercholesterolemia.  ROS:  A ROS was performed and pertinent positives and negatives are included.  Exam:  Filed Vitals:   10/09/14 1436  BP: 119/76    General appearance:  Normal Thyroid:  Symmetrical, normal in size, without palpable masses or nodularity. Respiratory  Auscultation:  Clear without wheezing or rhonchi Cardiovascular  Auscultation:  Regular rate, without rubs, murmurs or gallops  Edema/varicosities:  Not grossly evident Abdominal  Soft,nontender, without masses, guarding or rebound.  Liver/spleen:  No organomegaly noted  Hernia:  None appreciated  Skin  Inspection:  Grossly normal   Breasts: Examined lying and sitting.     Right: Without masses, retractions, discharge or axillary adenopathy.     Left: Without masses, retractions, discharge or axillary adenopathy. Gentitourinary   Inguinal/mons:  Normal without inguinal adenopathy  External genitalia:  Normal  BUS/Urethra/Skene's glands:  Normal  Vagina:  Normal  Cervix:  Normal  Uterus:   normal in size, shape and contour.  Midline and mobile  Adnexa/parametria:     Rt: Without masses or tenderness.   Lt: Without masses or tenderness.  Anus and perineum: Normal   Assessment/Plan:  24 y.o. SBF G1 P0  for annual exam.     Adenomyosis/dysmenorrhea Morbid obesity Monthly cycle/condoms  Plan: Adenomyosis reviewed, questions answered. Motrin 600 every 8 hours as needed for pain,  prescription proper use reviewed. Contraception reviewed, reviewed increased risks with OCs due to obesity. Instructed to call if cycles become less regular.  Will continue condoms. SBE's, continue to increase regular exercise and decrease calories for  weight loss. Weight Watches encouraged. Reviewed importance of weight loss prior to pregnancy CBC,  glucose, TSH, UA, Pap.    Harrington ChallengerYOUNG,NANCY J South Florida [facility] Treatment CenterWHNP, 4:25 PM 10/09/2014

## 2014-10-09 NOTE — Patient Instructions (Signed)
Health Maintenance Adopting a healthy lifestyle and getting preventive care can go a long way to promote health and wellness. Talk with your health care provider about what schedule of regular examinations is right for you. This is a good chance for you to check in with your provider about disease prevention and staying healthy. In between checkups, there are plenty of things you can do on your own. Experts have done a lot of research about which lifestyle changes and preventive measures are most likely to keep you healthy. Ask your health care provider for more information. WEIGHT AND DIET  Eat a healthy diet  Be sure to include plenty of vegetables, fruits, low-fat dairy products, and lean protein.  Do not eat a lot of foods high in solid fats, added sugars, or salt.  Get regular exercise. This is one of the most important things you can do for your health.  Most adults should exercise for at least 150 minutes each week. The exercise should increase your heart rate and make you sweat (moderate-intensity exercise).  Most adults should also do strengthening exercises at least twice a week. This is in addition to the moderate-intensity exercise.  Maintain a healthy weight  Body mass index (BMI) is a measurement that can be used to identify possible weight problems. It estimates body fat based on height and weight. Your health care provider can help determine your BMI and help you achieve or maintain a healthy weight.  For females 61 years of age and older:   A BMI below 18.5 is considered underweight.  A BMI of 18.5 to 24.9 is normal.  A BMI of 25 to 29.9 is considered overweight.  A BMI of 30 and above is considered obese.  Watch levels of cholesterol and blood lipids  You should start having your blood tested for lipids and cholesterol at 25 years of age, then have this test every 5 years.  You may need to have your cholesterol levels checked more often if:  Your lipid or  cholesterol levels are high.  You are older than 25 years of age.  You are at high risk for heart disease.  CANCER SCREENING   Lung Cancer  Lung cancer screening is recommended for adults 77-19 years old who are at high risk for lung cancer because of a history of smoking.  A yearly low-dose CT scan of the lungs is recommended for people who:  Currently smoke.  Have quit within the past 15 years.  Have at least a 30-pack-year history of smoking. A pack year is smoking an average of one pack of cigarettes a day for 1 year.  Yearly screening should continue until it has been 15 years since you quit.  Yearly screening should stop if you develop a health problem that would prevent you from having lung cancer treatment.  Breast Cancer  Practice breast self-awareness. This means understanding how your breasts normally appear and feel.  It also means doing regular breast self-exams. Let your health care provider know about any changes, no matter how small.  If you are in your 20s or 30s, you should have a clinical breast exam (CBE) by a health care provider every 1-3 years as part of a regular health exam.  If you are 15 or older, have a CBE every year. Also consider having a breast X-ray (mammogram) every year.  If you have a family history of breast cancer, talk to your health care provider about genetic screening.  If you are  at high risk for breast cancer, talk to your health care provider about having an MRI and a mammogram every year.  Breast cancer gene (BRCA) assessment is recommended for women who have family members with BRCA-related cancers. BRCA-related cancers include:  Breast.  Ovarian.  Tubal.  Peritoneal cancers.  Results of the assessment will determine the need for genetic counseling and BRCA1 and BRCA2 testing. Cervical Cancer Routine pelvic examinations to screen for cervical cancer are no longer recommended for nonpregnant women who are considered low  risk for cancer of the pelvic organs (ovaries, uterus, and vagina) and who do not have symptoms. A pelvic examination may be necessary if you have symptoms including those associated with pelvic infections. Ask your health care provider if a screening pelvic exam is right for you.   The Pap test is the screening test for cervical cancer for women who are considered at risk.  If you had a hysterectomy for a problem that was not cancer or a condition that could lead to cancer, then you no longer need Pap tests.  If you are older than 65 years, and you have had normal Pap tests for the past 10 years, you no longer need to have Pap tests.  If you have had past treatment for cervical cancer or a condition that could lead to cancer, you need Pap tests and screening for cancer for at least 20 years after your treatment.  If you no longer get a Pap test, assess your risk factors if they change (such as having a new sexual partner). This can affect whether you should start being screened again.  Some women have medical problems that increase their chance of getting cervical cancer. If this is the case for you, your health care provider may recommend more frequent screening and Pap tests.  The human papillomavirus (HPV) test is another test that may be used for cervical cancer screening. The HPV test looks for the virus that can cause cell changes in the cervix. The cells collected during the Pap test can be tested for HPV.  The HPV test can be used to screen women 30 years of age and older. Getting tested for HPV can extend the interval between normal Pap tests from three to five years.  An HPV test also should be used to screen women of any age who have unclear Pap test results.  After 25 years of age, women should have HPV testing as often as Pap tests.  Colorectal Cancer  This type of cancer can be detected and often prevented.  Routine colorectal cancer screening usually begins at 25 years of  age and continues through 25 years of age.  Your health care provider may recommend screening at an earlier age if you have risk factors for colon cancer.  Your health care provider may also recommend using home test kits to check for hidden blood in the stool.  A small camera at the end of a tube can be used to examine your colon directly (sigmoidoscopy or colonoscopy). This is done to check for the earliest forms of colorectal cancer.  Routine screening usually begins at age 50.  Direct examination of the colon should be repeated every 5-10 years through 25 years of age. However, you may need to be screened more often if early forms of precancerous polyps or small growths are found. Skin Cancer  Check your skin from head to toe regularly.  Tell your health care provider about any new moles or changes in   moles, especially if there is a change in a mole's shape or color.  Also tell your health care provider if you have a mole that is larger than the size of a pencil eraser.  Always use sunscreen. Apply sunscreen liberally and repeatedly throughout the day.  Protect yourself by wearing long sleeves, pants, a wide-brimmed hat, and sunglasses whenever you are outside. HEART DISEASE, DIABETES, AND HIGH BLOOD PRESSURE   Have your blood pressure checked at least every 1-2 years. High blood pressure causes heart disease and increases the risk of stroke.  If you are between 75 years and 42 years old, ask your health care provider if you should take aspirin to prevent strokes.  Have regular diabetes screenings. This involves taking a blood sample to check your fasting blood sugar level.  If you are at a normal weight and have a low risk for diabetes, have this test once every three years after 25 years of age.  If you are overweight and have a high risk for diabetes, consider being tested at a [place] age or more often. PREVENTING INFECTION  Hepatitis B  If you have a higher risk for  hepatitis B, you should be screened for this virus. You are considered at high risk for hepatitis B if:  You were born in a country where hepatitis B is common. Ask your health care provider which countries are considered high risk.  Your parents were born in a high-risk country, and you have not been immunized against hepatitis B (hepatitis B vaccine).  You have HIV or AIDS.  You use needles to inject street drugs.  You live with someone who has hepatitis B.  You have had sex with someone who has hepatitis B.  You get hemodialysis treatment.  You take certain medicines for conditions, including cancer, organ transplantation, and autoimmune conditions. Hepatitis C  Blood testing is recommended for:  Everyone born from [birth year] through 1966.  Anyone with known risk factors for hepatitis C. Sexually transmitted infections (STIs)  You should be screened for sexually transmitted infections (STIs) including gonorrhea and chlamydia if:  You are sexually active and are [place] than 25 years of age.  You are older than 25 years of age and your health care provider tells you that you are at risk for this type of infection.  Your sexual activity has changed since you were last screened and you are at an increased risk for chlamydia or gonorrhea. Ask your health care provider if you are at risk.  If you do not have HIV, but are at risk, it may be recommended that you take a prescription medicine daily to prevent HIV infection. This is called pre-exposure prophylaxis (PrEP). You are considered at risk if:  You are sexually active and do not regularly use condoms or know the HIV status of your partner(s).  You take drugs by injection.  You are sexually active with a partner who has HIV. Talk with your health care provider about whether you are at high risk of being infected with HIV. If you choose to begin PrEP, you should first be tested for HIV. You should then be tested every 3 months for  as long as you are taking PrEP.  PREGNANCY   If you are premenopausal and you may become pregnant, ask your health care provider about preconception counseling.  If you may become pregnant, take 400 to 800 micrograms (mcg) of folic acid every day.  If you want to prevent pregnancy, talk to your  health care provider about birth control (contraception). OSTEOPOROSIS AND MENOPAUSE   Osteoporosis is a disease in which the bones lose minerals and strength with aging. This can result in serious bone fractures. Your risk for osteoporosis can be identified using a bone density scan.  If you are 65 years of age or older, or if you are at risk for osteoporosis and fractures, ask your health care provider if you should be screened.  Ask your health care provider whether you should take a calcium or vitamin D supplement to lower your risk for osteoporosis.  Menopause may have certain physical symptoms and risks.  Hormone replacement therapy may reduce some of these symptoms and risks. Talk to your health care provider about whether hormone replacement therapy is right for you.  HOME CARE INSTRUCTIONS   Schedule regular health, dental, and eye exams.  Stay current with your immunizations.   Do not use any tobacco products including cigarettes, chewing tobacco, or electronic cigarettes.  If you are pregnant, do not drink alcohol.  If you are breastfeeding, limit how much and how often you drink alcohol.  Limit alcohol intake to no more than 1 drink per day for nonpregnant women. One drink equals 12 ounces of beer, 5 ounces of wine, or 1 ounces of hard liquor.  Do not use street drugs.  Do not share needles.  Ask your health care provider for help if you need support or information about quitting drugs.  Tell your health care provider if you often feel depressed.  Tell your health care provider if you have ever been abused or do not feel safe at home. Document Released: 12/06/2010  Document Revised: 10/07/2013 Document Reviewed: 04/24/2013 ExitCare Patient Information 2015 [employer]. This information is not intended to replace advice given to you by your health care provider. Make sure you discuss any questions you have with your health care provider. Exercise to Stay Healthy Exercise helps you become and stay healthy. EXERCISE IDEAS AND TIPS Choose exercises that:  You enjoy.  Fit into your day. You do not need to exercise really hard to be healthy. You can do exercises at a slow or medium level and stay healthy. You can:  Stretch before and after working out.  Try yoga, Pilates, or tai chi.  Lift weights.  Walk fast, swim, jog, run, climb stairs, bicycle, dance, or rollerskate.  Take aerobic classes. Exercises that burn about 150 calories:  Running 1  miles in 15 minutes.  Playing volleyball for 45 to 60 minutes.  Washing and waxing a car for 45 to 60 minutes.  Playing touch football for 45 minutes.  Walking 1  miles in 35 minutes.  Pushing a stroller 1  miles in 30 minutes.  Playing basketball for 30 minutes.  Raking leaves for 30 minutes.  Bicycling 5 miles in 30 minutes.  Walking 2 miles in 30 minutes.  Dancing for 30 minutes.  Shoveling snow for 15 minutes.  Swimming laps for 20 minutes.  Walking up stairs for 15 minutes.  Bicycling 4 miles in 15 minutes.  Gardening for 30 to 45 minutes.  Jumping rope for 15 minutes.  Washing windows or floors for 45 to 60 minutes. Document Released: 06/25/2010 Document Revised: 08/15/2011 Document Reviewed: 06/25/2010 ExitCare Patient Information 2015 [employer]. This information is not intended to replace advice given to you by your health care provider. Make sure you discuss any questions you have with your health care provider.  

## 2014-10-13 LAB — CYTOLOGY - PAP

## 2014-10-16 ENCOUNTER — Other Ambulatory Visit: Payer: Self-pay | Admitting: Gynecology

## 2014-10-16 MED ORDER — METRONIDAZOLE 500 MG PO TABS: 500.0000 mg | ORAL_TABLET | Freq: Two times a day (BID) | ORAL | Status: DC

## 2014-10-17 ENCOUNTER — Telehealth: Payer: Self-pay | Admitting: *Deleted

## 2014-10-17 NOTE — Telephone Encounter (Signed)
Pt called and left message in voicemail questions with trichomonas. I left message for pt to call.

## 2014-12-29 ENCOUNTER — Ambulatory Visit (INDEPENDENT_AMBULATORY_CARE_PROVIDER_SITE_OTHER): Payer: BLUE CROSS/BLUE SHIELD | Admitting: Gynecology

## 2014-12-29 ENCOUNTER — Encounter: Payer: Self-pay | Admitting: Gynecology

## 2014-12-29 VITALS — BP 130/78

## 2014-12-29 DIAGNOSIS — N912 Amenorrhea, unspecified: Secondary | ICD-10-CM

## 2014-12-29 LAB — PREGNANCY, URINE: Preg Test, Ur: NEGATIVE

## 2014-12-29 NOTE — Patient Instructions (Signed)
Office will call you with the blood pregnancy test results. Schedule and follow up for your ultrasound.

## 2014-12-29 NOTE — Progress Notes (Addendum)
Katie Marshall 1990-10-13 [id number]        24 y.o.  Presents complaining of LMP 10/05/2014 with regular monthly menses proceeding this. Had one day of light spotting several days ago. No pain/cramping. No bloating or vaginal discharge. Had 2 positive home pregnancy tests and one negative home pregnancy tests. The positive tests were several weeks ago. Not contraceptive and sexually active.  Past medical history,surgical history, problem list, medications, allergies, family history and social history were all reviewed and documented in the EPIC chart.  Directed ROS with pertinent positives and negatives documented in the history of present illness/assessment and plan.  Exam: Kim assistant Filed Vitals:   12/29/14 0958  BP: 130/78   General appearance:  Normal Abdomen: Obese, soft nontender without masses guarding rebound Pelvic: External BUS vagina normal. Cervix normal. Uterus difficult to palpate but grossly normal in size. Nontender. Adnexa without gross masses or tenderness.  Assessment/Plan:  24 y.o.   With above history.  UPT negative today.  Start with quantitative hCG. Baseline ultrasound given limits of exam. Possible scenarios reviewed with the patient to include ongoing pregnancy, early chemical pregnancy and loss. Contraception options also reviewed if indeed she is not pregnant. Patient unsure at this point whether she wants to start contraceptions or would except pregnancy. We'll further discuss at her ultrasound appointment.    Dara Lords MD, 10:22 AM 12/29/2014

## 2014-12-30 ENCOUNTER — Other Ambulatory Visit: Payer: Self-pay | Admitting: Gynecology

## 2014-12-30 LAB — HCG, QUANTITATIVE, PREGNANCY: hCG, Beta Chain, Quant, S: <2 m[IU]/mL

## 2014-12-30 MED ORDER — MEDROXYPROGESTERONE ACETATE 10 MG PO TABS: 10.0000 mg | ORAL_TABLET | Freq: Every day | ORAL | Status: AC

## 2015-01-19 ENCOUNTER — Other Ambulatory Visit: Payer: BLUE CROSS/BLUE SHIELD

## 2015-01-19 ENCOUNTER — Ambulatory Visit: Payer: BLUE CROSS/BLUE SHIELD | Admitting: Gynecology

## 2015-06-08 ENCOUNTER — Emergency Department (HOSPITAL_COMMUNITY): Payer: Managed Care, Other (non HMO)

## 2015-06-08 ENCOUNTER — Emergency Department (HOSPITAL_COMMUNITY)
Admission: EM | Admit: 2015-06-08 | Discharge: 2015-06-08 | Disposition: A | Discharge status: Home / Self Care | Payer: Managed Care, Other (non HMO) | Attending: Emergency Medicine | Admitting: Emergency Medicine

## 2015-06-08 ENCOUNTER — Encounter (HOSPITAL_COMMUNITY): Payer: Self-pay | Admitting: Emergency Medicine

## 2015-06-08 DIAGNOSIS — Z793 Long term (current) use of hormonal contraceptives: Secondary | ICD-10-CM | POA: Insufficient documentation

## 2015-06-08 DIAGNOSIS — Z880 Allergy status to penicillin: Secondary | ICD-10-CM | POA: Insufficient documentation

## 2015-06-08 DIAGNOSIS — H9203 Otalgia, bilateral: Secondary | ICD-10-CM | POA: Diagnosis not present

## 2015-06-08 DIAGNOSIS — J069 Acute upper respiratory infection, unspecified: Secondary | ICD-10-CM | POA: Diagnosis not present

## 2015-06-08 DIAGNOSIS — R05 Cough: Secondary | ICD-10-CM | POA: Diagnosis present

## 2015-06-08 NOTE — ED Notes (Signed)
PT DISCHARGED. INSTRUCTIONS GIVEN. AAOX3. PT IN NO APPARENT DISTRESS OR PAIN. THE OPPORTUNITY TO ASK QUESTIONS WAS PROVIDED. 

## 2015-06-08 NOTE — Discharge Instructions (Signed)
Upper Respiratory Infection, Adult Most upper respiratory infections (URIs) are a viral infection of the air passages leading to the lungs. A URI affects the nose, throat, and upper air passages. The most common type of URI is nasopharyngitis and is typically referred to as "the common cold." URIs run their course and usually go away on their own. Most of the time, a URI does not require medical attention, but sometimes a bacterial infection in the upper airways can follow a viral infection. This is called a secondary infection. Sinus and middle ear infections are common types of secondary upper respiratory infections. Bacterial pneumonia can also complicate a URI. A URI can worsen asthma and chronic obstructive pulmonary disease (COPD). Sometimes, these complications can require emergency medical care and may be life threatening.  CAUSES Almost all URIs are caused by viruses. A virus is a type of germ and can spread from one person to another.  RISKS FACTORS You may be at risk for a URI if:   You smoke.   You have chronic heart or lung disease.  You have a weakened defense (immune) system.   You are very young or very old.   You have nasal allergies or asthma.  You work in crowded or poorly ventilated areas.  You work in health care facilities or schools. SIGNS AND SYMPTOMS  Symptoms typically develop 2-3 days after you come in contact with a cold virus. Most viral URIs last 7-10 days. However, viral URIs from the influenza virus (flu virus) can last 14-18 days and are typically more severe. Symptoms may include:   Runny or stuffy (congested) nose.   Sneezing.   Cough.   Sore throat.   Headache.   Fatigue.   Fever.   Loss of appetite.   Pain in your forehead, behind your eyes, and over your cheekbones (sinus pain).  Muscle aches.  DIAGNOSIS  Your health care provider may diagnose a URI by:  Physical exam.  Tests to check that your symptoms are not due to  another condition such as:  Strep throat.  Sinusitis.  Pneumonia.  Asthma. TREATMENT  A URI goes away on its own with time. It cannot be cured with medicines, but medicines may be prescribed or recommended to relieve symptoms. Medicines may help:  Reduce your fever.  Reduce your cough.  Relieve nasal congestion. HOME CARE INSTRUCTIONS   Take medicines only as directed by your health care provider.   Gargle warm saltwater or take cough drops to comfort your throat as directed by your health care provider.  Use a warm mist humidifier or inhale steam from a shower to increase air moisture. This may make it easier to breathe.  Drink enough fluid to keep your urine clear or pale yellow.   Eat soups and other clear broths and maintain good nutrition.   Rest as needed.   Return to work when your temperature has returned to normal or as your health care provider advises. You may need to stay home longer to avoid infecting others. You can also use a face mask and careful hand washing to prevent spread of the virus.  Increase the usage of your inhaler if you have asthma.   Do not use any tobacco products, including cigarettes, chewing tobacco, or electronic cigarettes. If you need help quitting, ask your health care provider. PREVENTION  The best way to protect yourself from getting a cold is to practice good hygiene.   Avoid oral or hand contact with people with cold  symptoms.   Wash your hands often if contact occurs.  There is no clear evidence that vitamin C, vitamin E, echinacea, or exercise reduces the chance of developing a cold. However, it is always recommended to get plenty of rest, exercise, and practice good nutrition.  SEEK MEDICAL CARE IF:   You are getting worse rather than better.   Your symptoms are not controlled by medicine.   You have chills.  You have worsening shortness of breath.  You have brown or red mucus.  You have yellow or brown nasal  discharge.  You have pain in your face, especially when you bend forward.  You have a fever.  You have swollen neck glands.  You have pain while swallowing.  You have white areas in the back of your throat. SEEK IMMEDIATE MEDICAL CARE IF:   You have severe or persistent:  Headache.  Ear pain.  Sinus pain.  Chest pain.  You have chronic lung disease and any of the following:  Wheezing.  Prolonged cough.  Coughing up blood.  A change in your usual mucus.  You have a stiff neck.  You have changes in your:  Vision.  Hearing.  Thinking.  Mood. MAKE SURE YOU:   Understand these instructions.  Will watch your condition.  Will get help right away if you are not doing well or get worse.   This information is not intended to replace advice given to you by your health care provider. Make sure you discuss any questions you have with your health care provider.   Document Released: 11/16/2000 Document Revised: 10/07/2014 Document Reviewed: 08/28/2013 Elsevier Interactive Patient Education [employer] Continue taking over the counter medication I would suggest Sudafed or Phenylephrine

## 2015-06-08 NOTE — ED Provider Notes (Signed)
CSN: [id number]     Arrival date & time 06/08/15  1737 History   By signing my name below, I, Katie Marshall, attest that this documentation has been prepared under the direction and in the presence of Earley Favor, FNP.  Electronically Signed: Arlan Marshall, ED Scribe. 06/08/2015. 8:13 PM.   Chief Complaint  Patient presents with  . Cough  . Sore Throat  . Otalgia   The history is provided by the patient. No language interpreter was used.    HPI Comments: Katie Marshall is a 25 y.o. female without any pertinent past medical history who presents to the Emergency Department complaining of constant, ongoing cough x 1 day. Pt also reports sore throat, neck pain, sinus pressure, and bilateral otalgia. No aggravating or alleviating factors at this time. OTC Alka Seltzer attempted prior to arrival without any improvement. No recent fever, chills, nausea, or vomiting. Pt with known allergies to Penicillins.  PCP: No primary care provider on file.    History reviewed. No pertinent past medical history. Past Surgical History  Procedure Laterality Date  . Miscariage    . Hernia repair     Family History  Problem Relation Age of Onset  . Cervical cancer Maternal Grandmother    Social History  Substance Use Topics  . Smoking status: Never Smoker   . Smokeless tobacco: None  . Alcohol Use: No   OB History    No data available     Review of Systems  Constitutional: Negative for fever and chills.  HENT: Positive for congestion, ear pain, sinus pressure and sore throat.   Respiratory: Positive for cough.   Gastrointestinal: Negative for nausea, vomiting and abdominal pain.  Musculoskeletal: Positive for neck pain. Negative for myalgias.  Psychiatric/Behavioral: Negative for confusion.      Allergies  Penicillins  Home Medications   Prior to Admission medications   Medication Sig Start Date End Date Taking? Authorizing Provider  ibuprofen (ADVIL,MOTRIN) 600 MG tablet Take 1  tablet (600 mg total) by mouth every 6 (six) hours as needed. 10/09/14   Harrington Challenger, NP  medroxyPROGESTERone (PROVERA) 10 MG tablet Take 1 tablet (10 mg total) by mouth daily. 12/30/14   Dara Lords, MD   Triage Vitals: BP 161/117 mmHg  Pulse 93  Temp(Src) 98.4 F (36.9 C) (Oral)  Resp 20  SpO2 96%   Physical Exam  Constitutional: She is oriented to person, place, and time. She appears well-developed and well-nourished.  HENT:  Head: Normocephalic.  Mouth/Throat: No oropharyngeal exudate.  Clear rhinitis notes TMs are clear but canals are slightly red  Eyes: EOM are normal.  Neck: Normal range of motion.  Pulmonary/Chest: Effort normal and breath sounds normal.  Clear and symmetrical breath sounds   Abdominal: She exhibits no distension.  Musculoskeletal: Normal range of motion.  Neurological: She is alert and oriented to person, place, and time.  Psychiatric: She has a normal mood and affect.  Nursing note and vitals reviewed.   ED Course  Procedures (including critical care time)  DIAGNOSTIC STUDIES: Oxygen Saturation is 96% on RA, adequate by my interpretation.    COORDINATION OF CARE: 8:10 PM- Will order CXR. Discussed treatment plan with pt at bedside and pt agreed to plan.     Labs Review Labs Reviewed - No data to display  Imaging Review Dg Chest 2 View  06/08/2015  CLINICAL DATA:  Pt c/o cough, sob, ha, nasal congestion x 3 days w/intermittent fever, hx asthma as a child  EXAM: CHEST  2 VIEW COMPARISON:  None. FINDINGS: Midline trachea.  Normal heart size and mediastinal contours. Sharp costophrenic angles.  No pneumothorax.  Clear lungs. Lateral view degraded by patient arm position. Exam degraded by patient body habitus. IMPRESSION: No active cardiopulmonary disease. Electronically Signed   By: Jeronimo GreavesKyle  Talbot M.D.   On: 06/08/2015 19:42   I have personally reviewed and evaluated these images and lab results as part of my medical decision-making.   EKG  Interpretation None      MDM   Final diagnoses:  None   Recommend taking Sudafed or Phenylephrine  I personally performed the services described in this documentation, which was scribed in my presence. The recorded information has been reviewed and is accurate.  Earley FavorGail Shequita Peplinski, NP 06/08/15 2022  Earley FavorGail Chipper Koudelka, NP 06/08/15 2022  Nelva Nayobert Beaton, MD 06/14/15 [phone]

## 2015-06-08 NOTE — ED Notes (Signed)
Pt c/o nasal drainage, sore throat, neck pain, eye drainage, sinus pressure, ear pain, cough onset 06-06-15. Tympanic membrane pearly gray, intact, cone of light in appropriate position.

## 2015-07-03 ENCOUNTER — Encounter (HOSPITAL_COMMUNITY): Payer: Self-pay | Admitting: Emergency Medicine

## 2015-07-03 ENCOUNTER — Emergency Department (HOSPITAL_COMMUNITY)
Admission: EM | Admit: 2015-07-03 | Discharge: 2015-07-03 | Disposition: A | Discharge status: Home / Self Care | Payer: Managed Care, Other (non HMO) | Attending: Emergency Medicine | Admitting: Emergency Medicine

## 2015-07-03 DIAGNOSIS — Z880 Allergy status to penicillin: Secondary | ICD-10-CM | POA: Insufficient documentation

## 2015-07-03 DIAGNOSIS — Z79899 Other long term (current) drug therapy: Secondary | ICD-10-CM | POA: Insufficient documentation

## 2015-07-03 DIAGNOSIS — J069 Acute upper respiratory infection, unspecified: Secondary | ICD-10-CM | POA: Insufficient documentation

## 2015-07-03 DIAGNOSIS — R05 Cough: Secondary | ICD-10-CM | POA: Diagnosis present

## 2015-07-03 DIAGNOSIS — J9801 Acute bronchospasm: Secondary | ICD-10-CM | POA: Diagnosis not present

## 2015-07-03 LAB — RAPID STREP SCREEN (MED CTR MEBANE ONLY): STREPTOCOCCUS, GROUP A SCREEN (DIRECT): NEGATIVE

## 2015-07-03 MED ORDER — ALBUTEROL SULFATE (2.5 MG/3ML) 0.083% IN NEBU
5.0000 mg | INHALATION_SOLUTION | Freq: Once | RESPIRATORY_TRACT | Status: AC
  Administered 2015-07-03: 5 mg via RESPIRATORY_TRACT
  Filled 2015-07-03: qty 6

## 2015-07-03 MED ORDER — IPRATROPIUM BROMIDE 0.02 % IN SOLN
RESPIRATORY_TRACT | Status: AC
  Filled 2015-07-03: qty 2.5

## 2015-07-03 MED ORDER — ALBUTEROL SULFATE HFA 108 (90 BASE) MCG/ACT IN AERS
2.0000 | INHALATION_SPRAY | RESPIRATORY_TRACT | Status: DC | PRN
  Administered 2015-07-03: 2 via RESPIRATORY_TRACT
  Filled 2015-07-03: qty 6.7

## 2015-07-03 MED ORDER — HYDROCOD POLST-CPM POLST ER 10-8 MG/5ML PO SUER
5.0000 mL | Freq: Every evening | ORAL | Status: AC | PRN
Start: 2015-07-03 — End: ?

## 2015-07-03 MED ORDER — IPRATROPIUM BROMIDE 0.02 % IN SOLN
0.5000 mg | Freq: Once | RESPIRATORY_TRACT | Status: AC
  Administered 2015-07-03: 0.5 mg via RESPIRATORY_TRACT
  Filled 2015-07-03: qty 2.5

## 2015-07-03 NOTE — ED Notes (Signed)
Patient here with complaints of sore throat, chest pain, SOB, and headache x2 days. Patient ambulatory in triage.

## 2015-07-03 NOTE — ED Provider Notes (Signed)
CSN: [id number]     Arrival date & time 07/03/15  3086 History   First MD Initiated Contact with Patient 07/03/15 0252     Chief Complaint  Patient presents with  . Headache  . Chest Pain  . Cough  . Sore Throat     (Consider location/radiation/quality/duration/timing/severity/associated sxs/prior Treatment) HPI Comments: Patient presents with cough that is persistently dry, sore throat, chest tightness and SOB for the past 2-3 days. No fever or night sweats. No nausea, vomiting or diarrhea. She was seen on 06/08/15 with similar symptoms that were diagnosed as "URI" and that resolved between then and onset of current symptoms. 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   Patient is a 25 y.o. female presenting with chest pain, cough, and pharyngitis. The history is provided by the patient. No language interpreter was used.  Chest Pain Associated symptoms: cough and headache   Associated symptoms: no dysphagia, no fever, no nausea and not vomiting   Cough Associated symptoms: chest pain, headaches and sore throat   Associated symptoms: no chills, no fever and no myalgias   Sore Throat Associated symptoms include chest pain, coughing, headaches and a sore throat. Pertinent negatives include no chills, fever, myalgias, nausea or vomiting.    History reviewed. No pertinent past medical history. Past Surgical History  Procedure Laterality Date  . Miscariage    . Hernia repair     Family History  Problem Relation Age of Onset  . Cervical cancer Maternal Grandmother    Social History  Substance Use Topics  . Smoking status: Never Smoker   . Smokeless tobacco: None  . Alcohol Use: No   OB History    No data available     Review of Systems  Constitutional: Negative for fever and chills.  HENT: Positive for sore throat. Negative for trouble swallowing.   Respiratory: Positive for cough.   Cardiovascular:  Positive for chest pain.  Gastrointestinal: Negative.  Negative for nausea and vomiting.  Musculoskeletal: Negative.  Negative for myalgias and neck stiffness.  Skin: Negative.   Neurological: Positive for headaches.      Allergies  Penicillins  Home Medications   Prior to Admission medications   Medication Sig Start Date End Date Taking? Authorizing Provider  acetaminophen (TYLENOL) 500 MG tablet Take 1,000 mg by mouth every 6 (six) hours as needed for mild pain.   Yes Historical Provider, MD  albuterol (PROVENTIL HFA;VENTOLIN HFA) 108 (90 Base) MCG/ACT inhaler Inhale 1-2 puffs into the lungs every 6 (six) hours as needed for wheezing or shortness of breath.   Yes Historical Provider, MD  guaifenesin (ROBITUSSIN CHEST CONGESTION) 100 MG/5ML syrup Take 200 mg by mouth 3 (three) times daily as needed for cough.   Yes Historical Provider, MD  chlorpheniramine-HYDROcodone (TUSSIONEX PENNKINETIC ER) 10-8 MG/5ML SUER Take 5 mLs by mouth at bedtime as needed for cough. 07/03/15   Elpidio Anis, PA-C  ibuprofen (ADVIL,MOTRIN) 600 MG tablet Take 1 tablet (600 mg total) by mouth every 6 (six) hours as needed. Patient not taking: Reported on 07/03/2015 10/09/14   Harrington Challenger, NP  medroxyPROGESTERone (PROVERA) 10 MG tablet Take 1 tablet (10 mg total) by mouth daily. Patient not taking: Reported on 07/03/2015 12/30/14   Dara Lords, MD   BP 162/121 mmHg  Pulse 87  Temp(Src) 98.1 F (36.7 C) (Oral)  Resp 20  SpO2 100% Physical Exam  Constitutional: She  is oriented to person, place, and time. She appears well-developed and well-nourished.  HENT:  Head: Normocephalic.  Right Ear: Tympanic membrane normal.  Left Ear: Tympanic membrane normal.  Nose: No mucosal edema.  Mouth/Throat: Uvula is midline and mucous membranes are normal. Mucous membranes are not dry. Posterior oropharyngeal erythema present. No posterior oropharyngeal edema.  Eyes: Conjunctivae are normal.  Neck: Normal range of  motion. Neck supple.  Cardiovascular: Normal rate and regular rhythm.   No murmur heard. Pulmonary/Chest: Effort normal and breath sounds normal. She has no wheezes. She has no rales.  Actively coughing.  Abdominal: Soft. Bowel sounds are normal. There is no tenderness. There is no rebound and no guarding.  Musculoskeletal: Normal range of motion.  Neurological: She is alert and oriented to person, place, and time.  Skin: Skin is warm and dry. No rash noted.  Psychiatric: She has a normal mood and affect.    ED Course  Procedures (including critical care time) Labs Review Labs Reviewed  RAPID STREP SCREEN (NOT AT [facility])  CULTURE, GROUP A STREP [facility])    Imaging Review No results found. I have personally reviewed and evaluated these images and lab results as part of my medical decision-making.   EKG Interpretation None      MDM   Final diagnoses:  URI (upper respiratory infection)  Bronchospasm    She feels better after nebulizer treatment and is coughing less. She is well appearing, comfortable with no fever and no hypoxia. Will provide inhaler - she reports she is out - and cough medication for night time use. Recommend other symptomatic measures.     Elpidio Anis, PA-C 07/03/15 [id number]  Dione Booze, MD 07/03/15 [phone]

## 2015-07-03 NOTE — Discharge Instructions (Signed)
Bronchospasm, Adult A bronchospasm is a spasm or tightening of the airways going into the lungs. During a bronchospasm breathing becomes more difficult because the airways get smaller. When this happens there can be coughing, a whistling sound when breathing (wheezing), and difficulty breathing. Bronchospasm is often associated with asthma, but not all patients who experience a bronchospasm have asthma. CAUSES  A bronchospasm is caused by inflammation or irritation of the airways. The inflammation or irritation may be triggered by:   Allergies (such as to animals, pollen, food, or mold). Allergens that cause bronchospasm may cause wheezing immediately after exposure or many hours later.   Infection. Viral infections are believed to be the most common cause of bronchospasm.   Exercise.   Irritants (such as pollution, cigarette smoke, strong odors, aerosol sprays, and paint fumes).   Weather changes. Winds increase molds and pollens in the air. Rain refreshes the air by washing irritants out. Cold air may cause inflammation.   Stress and emotional upset.  SIGNS AND SYMPTOMS   Wheezing.   Excessive nighttime coughing.   Frequent or severe coughing with a simple cold.   Chest tightness.   Shortness of breath.  DIAGNOSIS  Bronchospasm is usually diagnosed through a history and physical exam. Tests, such as chest X-rays, are sometimes done to look for other conditions. TREATMENT   Inhaled medicines can be given to open up your airways and help you breathe. The medicines can be given using either an inhaler or a nebulizer machine.  Corticosteroid medicines may be given for severe bronchospasm, usually when it is associated with asthma. HOME CARE INSTRUCTIONS   Always have a plan prepared for seeking medical care. Know when to call your health care provider and local emergency services (911 in the [place] Know where you can access local emergency care.  Only take medicines as  directed by your health care provider.  If you were prescribed an inhaler or nebulizer machine, ask your health care provider to explain how to use it correctly. Always use a spacer with your inhaler if you were given one.  It is necessary to remain calm during an attack. Try to relax and breathe more slowly.  Control your home environment in the following ways:   Change your heating and air conditioning filter at least once a month.   Limit your use of fireplaces and wood stoves.  Do not smoke and do not allow smoking in your home.   Avoid exposure to perfumes and fragrances.   Get rid of pests (such as roaches and mice) and their droppings.   Throw away plants if you see mold on them.   Keep your house clean and dust free.   Replace carpet with wood, tile, or vinyl flooring. Carpet can trap dander and dust.   Use allergy-proof pillows, mattress covers, and box spring covers.   Wash bed sheets and blankets every week in hot water and dry them in a dryer.   Use blankets that are made of polyester or cotton.   Wash hands frequently. SEEK MEDICAL CARE IF:   You have muscle aches.   You have chest pain.   The sputum changes from clear or white to yellow, green, gray, or bloody.   The sputum you cough up gets thicker.   There are problems that may be related to the medicine you are given, such as a rash, itching, swelling, or trouble breathing.  SEEK IMMEDIATE MEDICAL CARE IF:   You have worsening wheezing and coughing  even after taking your prescribed medicines.   You have increased difficulty breathing.   You develop severe chest pain. MAKE SURE YOU:   Understand these instructions.  Will watch your condition.  Will get help right away if you are not doing well or get worse.   This information is not intended to replace advice given to you by your health care provider. Make sure you discuss any questions you have with your health care  provider.   Document Released: 05/26/2003 Document Revised: 06/13/2014 Document Reviewed: 11/12/2012 Elsevier Interactive Patient Education 2016 Elsevier Inc. Sore Throat A sore throat is pain, burning, irritation, or scratchiness of the throat. There is often pain or tenderness when swallowing or talking. A sore throat may be accompanied by other symptoms, such as coughing, sneezing, fever, and swollen neck glands. A sore throat is often the first sign of another sickness, such as a cold, flu, strep throat, or mononucleosis (commonly known as mono). Most sore throats go away without medical treatment. CAUSES  The most common causes of a sore throat include:  A viral infection, such as a cold, flu, or mono.  A bacterial infection, such as strep throat, tonsillitis, or whooping cough.  Seasonal allergies.  Dryness in the air.  Irritants, such as smoke or pollution.  Gastroesophageal reflux disease (GERD). HOME CARE INSTRUCTIONS   Only take over-the-counter medicines as directed by your caregiver.  Drink enough fluids to keep your urine clear or pale yellow.  Rest as needed.  Try using throat sprays, lozenges, or sucking on hard candy to ease any pain (if older than 4 years or as directed).  Sip warm liquids, such as broth, herbal tea, or warm water with honey to relieve pain temporarily. You may also eat or drink cold or frozen liquids such as frozen ice pops.  Gargle with salt water (mix 1 tsp salt with 8 oz of water).  Do not smoke and avoid secondhand smoke.  Put a cool-mist humidifier in your bedroom at night to moisten the air. You can also turn on a hot shower and sit in the bathroom with the door closed for 5-10 minutes. SEEK IMMEDIATE MEDICAL CARE IF:  You have difficulty breathing.  You are unable to swallow fluids, soft foods, or your saliva.  You have increased swelling in the throat.  Your sore throat does not get better in 7 days.  You have nausea and  vomiting.  You have a fever or persistent symptoms for more than 2-3 days.  You have a fever and your symptoms suddenly get worse. MAKE SURE YOU:   Understand these instructions.  Will watch your condition.  Will get help right away if you are not doing well or get worse.   This information is not intended to replace advice given to you by your health care provider. Make sure you discuss any questions you have with your health care provider.   Document Released: 06/30/2004 Document Revised: 06/13/2014 Document Reviewed: 01/29/2012 Elsevier Interactive Patient Education [employer]

## 2015-07-05 LAB — CULTURE, GROUP A STREP (THRC)

## 2016-04-21 IMAGING — DX DG CHEST 2V
2 series · 2 of 2 positions shown · non-contrast
Comparison: None.

CLINICAL DATA: Pt c/o cough, sob, ha, nasal congestion x 3 days
w/intermittent fever, hx asthma as a child

EXAM:
CHEST  2 VIEW

[chest pa]
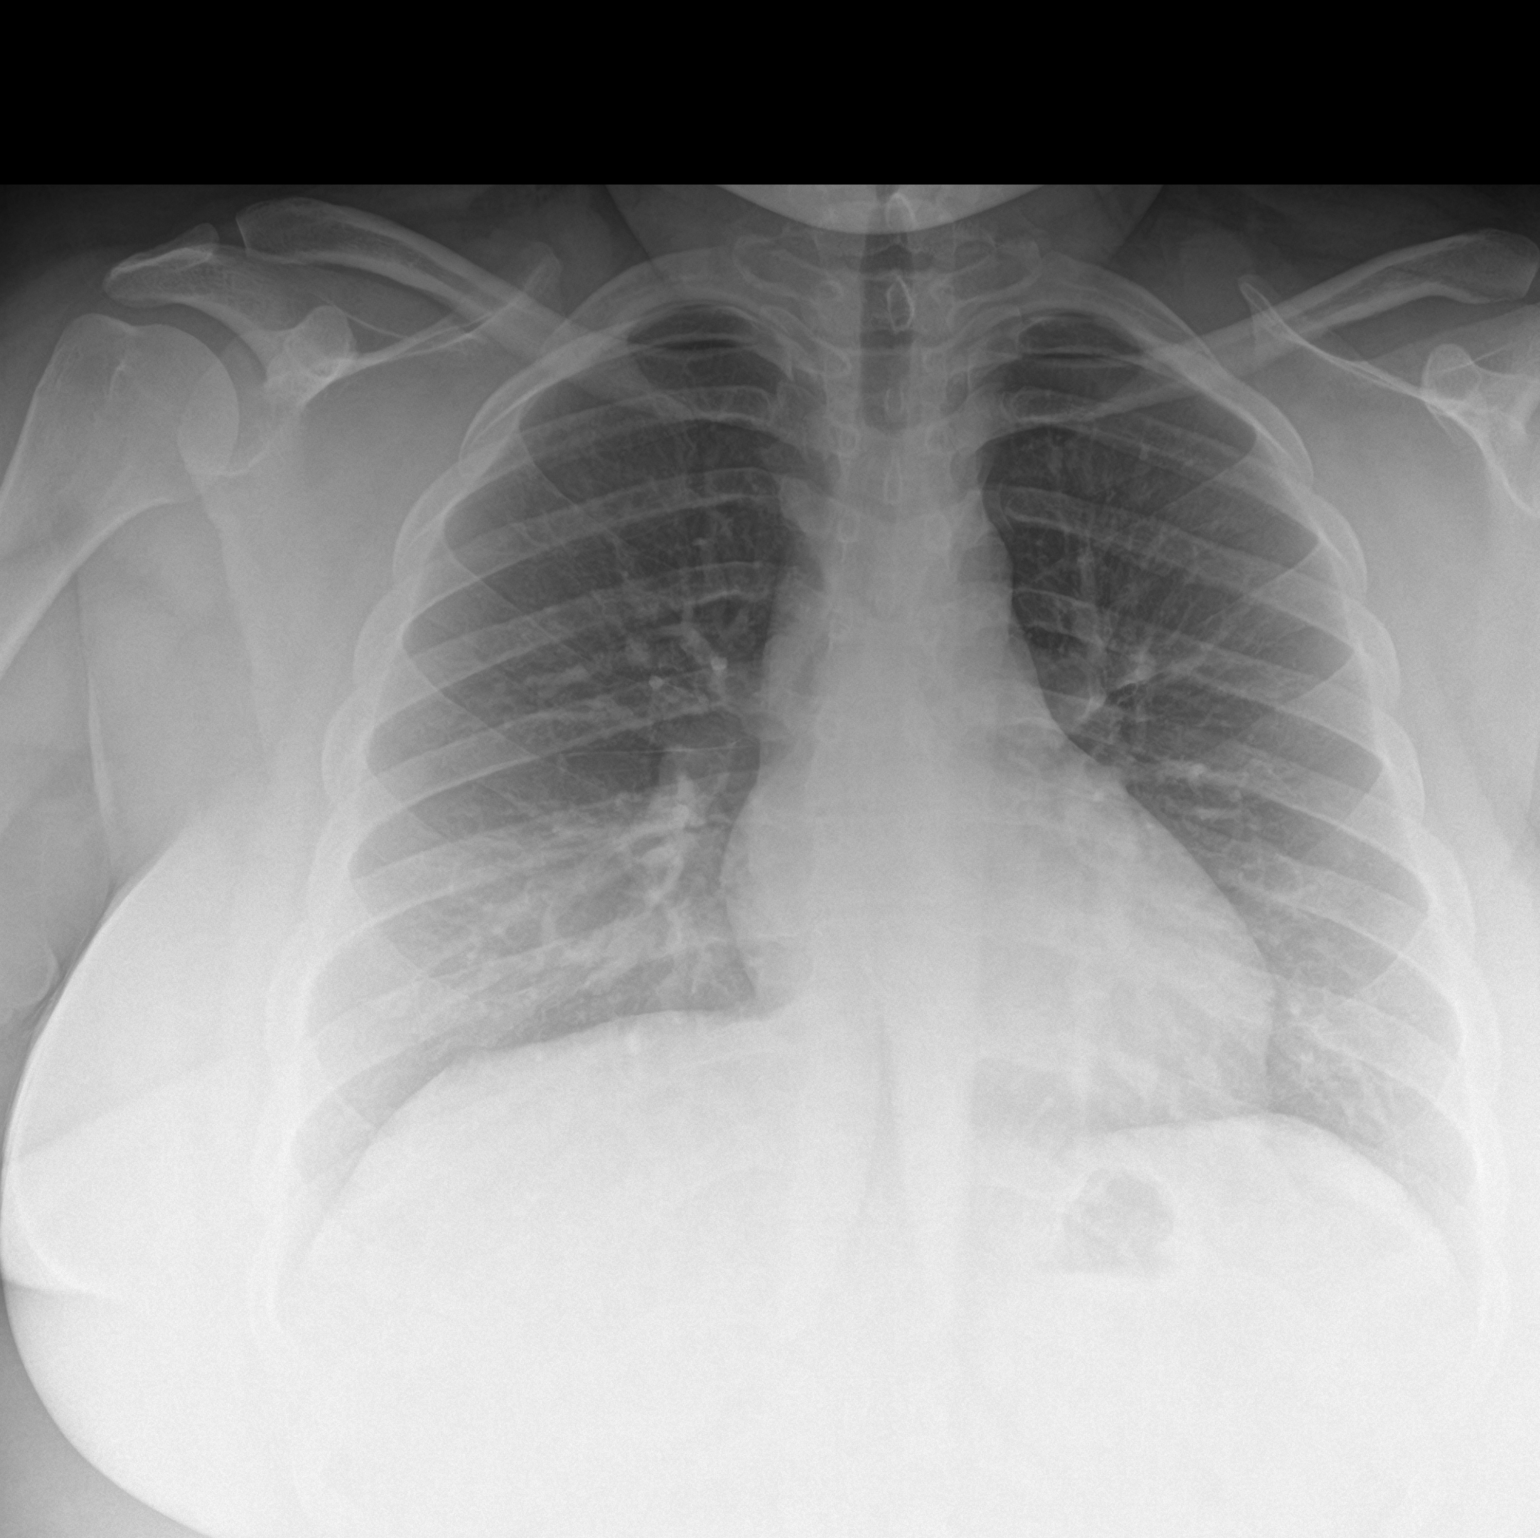

[chest lat]
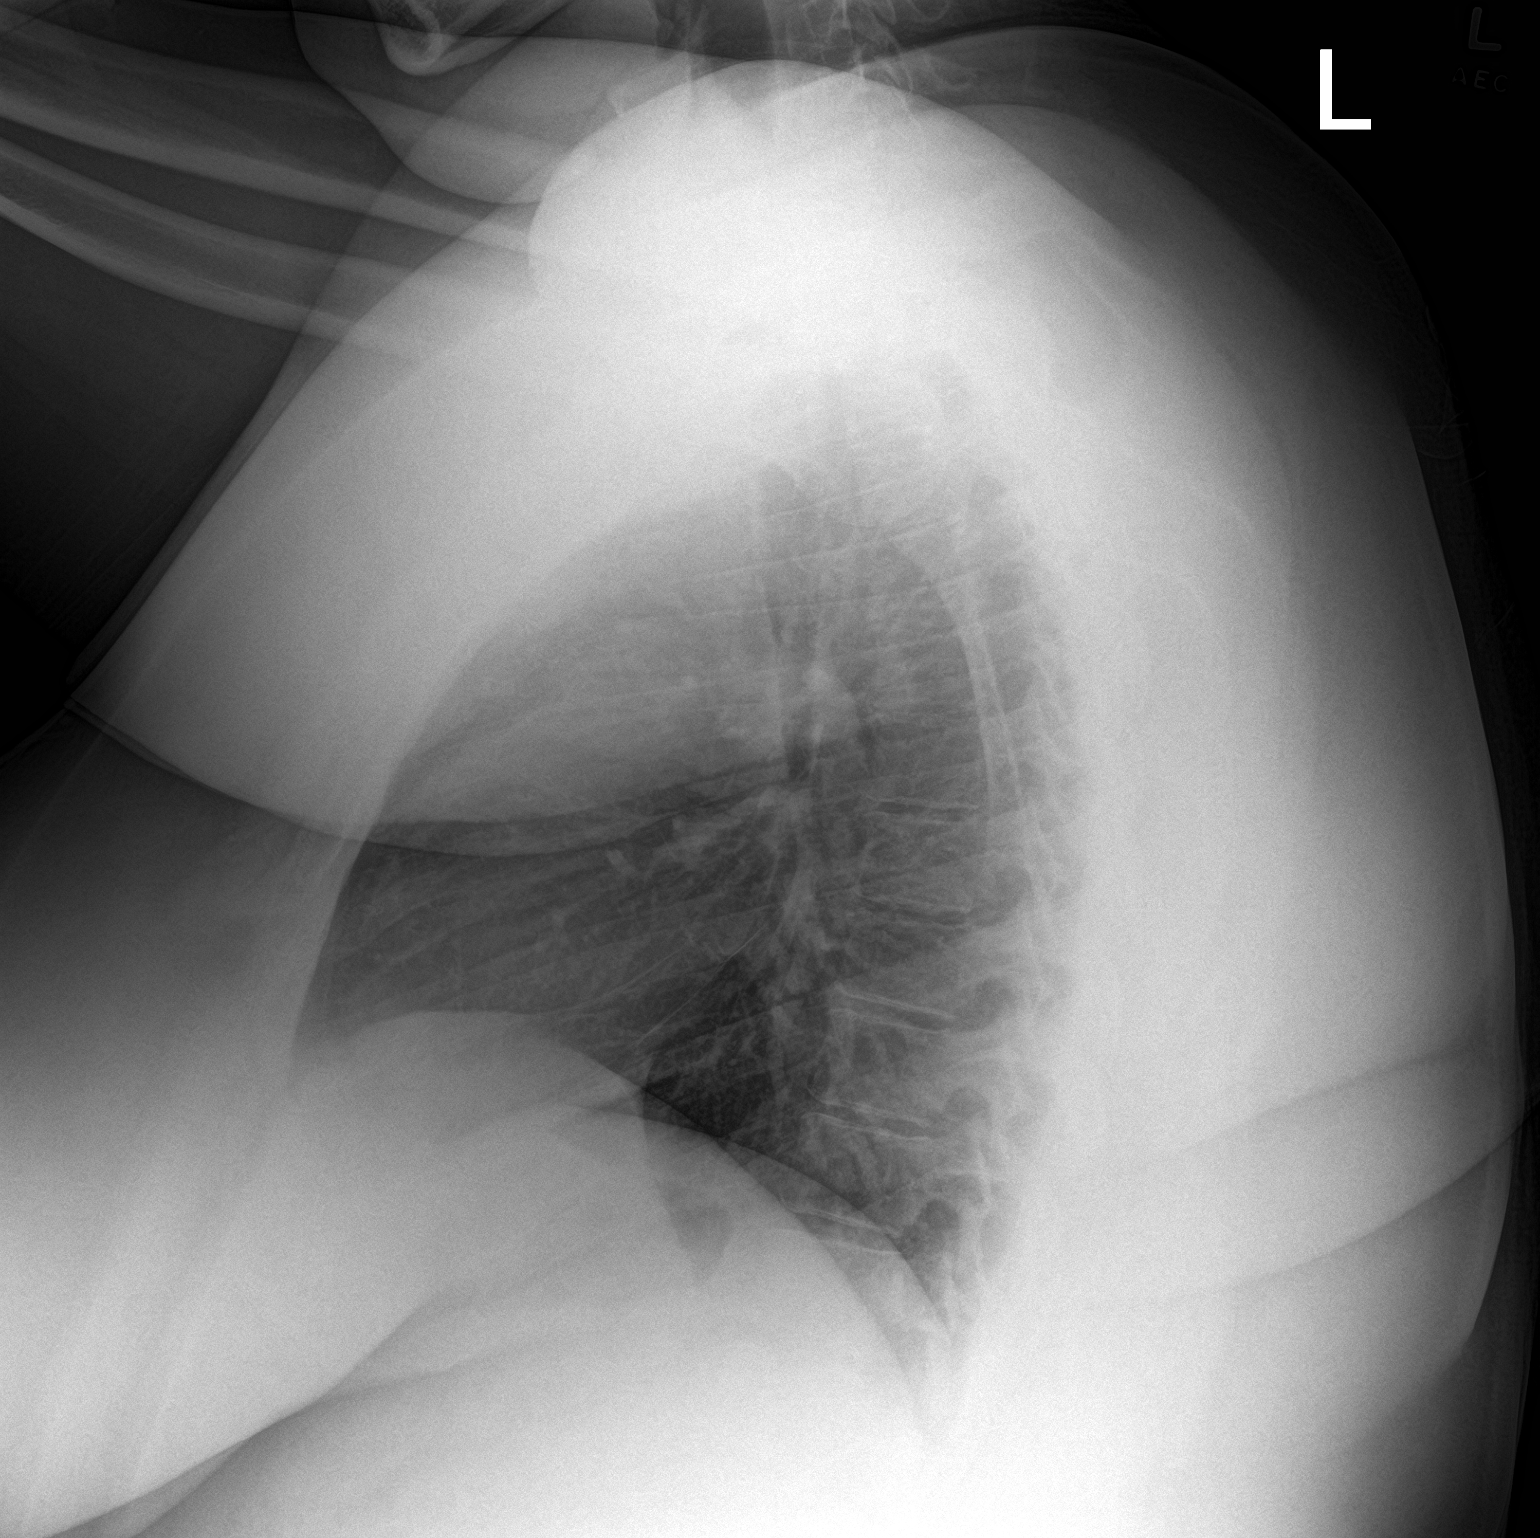

[2 of 2 positions shown; findings below may reference images not displayed]

FINDINGS: Midline trachea.  Normal heart size and mediastinal contours.

Sharp costophrenic angles.  No pneumothorax.  Clear lungs.

Lateral view degraded by patient arm position. Exam degraded by
patient body habitus.
IMPRESSION: No active cardiopulmonary disease.

## 2016-07-20 ENCOUNTER — Encounter (HOSPITAL_COMMUNITY): Payer: Self-pay | Admitting: Emergency Medicine

## 2016-07-20 ENCOUNTER — Emergency Department (HOSPITAL_COMMUNITY)
Admission: EM | Admit: 2016-07-20 | Discharge: 2016-07-20 | Disposition: A | Discharge status: Home / Self Care | Payer: Managed Care, Other (non HMO) | Attending: Emergency Medicine | Admitting: Emergency Medicine

## 2016-07-20 DIAGNOSIS — J069 Acute upper respiratory infection, unspecified: Secondary | ICD-10-CM

## 2016-07-20 MED ORDER — BENZONATATE 100 MG PO CAPS: 100.0000 mg | ORAL_CAPSULE | Freq: Three times a day (TID) | ORAL | 0 refills | Status: AC

## 2016-07-20 NOTE — ED Notes (Signed)
Pt ambulatory and independent at discharge.  Verbalized understanding of discharge instructions 

## 2016-07-20 NOTE — ED Provider Notes (Signed)
[facility]-EMERGENCY DEPT Provider Note   CSN: [id number] Arrival date & time: 07/20/16  1824   By signing my name below, I, Nelwyn SalisburyJoshua Fowler, attest that this documentation has been prepared under the direction and in the presence of non-physician practitioner, Audry Piliyler Shawndrea Rutkowski, PA-C. Electronically Signed: Nelwyn SalisburyJoshua Fowler, Scribe. 07/20/2016. 8:10 PM.  History   Chief Complaint Chief Complaint  Patient presents with  . URI   The history is provided by the patient. No language interpreter was used.    HPI Comments:  Katie Marshall is a 26 y.o. female with pmhx of asthma who presents to the Emergency Department complaining of frequent, unchanged cough onset 3 days ago. Pt describes her cough as nonproductive and dry. She reports associated sore throat, rhinorrhea, ear pain, nausea and SOB. Pt has tried mucinex and robitussin at home with minimal relief. She denies any fever, vomiting or diarrhea.   History reviewed. No pertinent past medical history.  Patient Active Problem List   Diagnosis Date Noted  . Type O blood, Rh negative 10/09/2014  . Anovulatory bleeding 11/09/2012    Past Surgical History:  Procedure Laterality Date  . HERNIA REPAIR    . miscariage      OB History    No data available       Home Medications    Prior to Admission medications   Medication Sig Start Date End Date Taking? Authorizing Provider  acetaminophen (TYLENOL) 500 MG tablet Take 1,000 mg by mouth every 6 (six) hours as needed for mild pain.    Historical Provider, MD  albuterol (PROVENTIL HFA;VENTOLIN HFA) 108 (90 Base) MCG/ACT inhaler Inhale 1-2 puffs into the lungs every 6 (six) hours as needed for wheezing or shortness of breath.    Historical Provider, MD  chlorpheniramine-HYDROcodone (TUSSIONEX PENNKINETIC ER) 10-8 MG/5ML SUER Take 5 mLs by mouth at bedtime as needed for cough. 07/03/15   Elpidio AnisShari Upstill, PA-C  guaifenesin (ROBITUSSIN CHEST CONGESTION) 100 MG/5ML syrup Take 200 mg by mouth 3 (three)  times daily as needed for cough.    Historical Provider, MD  ibuprofen (ADVIL,MOTRIN) 600 MG tablet Take 1 tablet (600 mg total) by mouth every 6 (six) hours as needed. Patient not taking: Reported on 07/03/2015 10/09/14   Harrington ChallengerNancy J Young, NP  medroxyPROGESTERone (PROVERA) 10 MG tablet Take 1 tablet (10 mg total) by mouth daily. Patient not taking: Reported on 07/03/2015 12/30/14   Dara Lordsimothy P Fontaine, MD    Family History Family History  Problem Relation Age of Onset  . Cervical cancer Maternal Grandmother     Social History Social History  Substance Use Topics  . Smoking status: Never Smoker  . Smokeless tobacco: Not on file  . Alcohol use No     Allergies   Penicillins   Review of Systems Review of Systems  Constitutional: Negative for fever.  HENT: Positive for ear pain, rhinorrhea and sore throat.   Respiratory: Positive for cough and shortness of breath.   Gastrointestinal: Positive for nausea. Negative for diarrhea and vomiting.     Physical Exam Updated Vital Signs BP 146/69   Pulse 108   Temp 98.3 F (36.8 C) (Oral)   Resp 18   SpO2 99%   Physical Exam  Constitutional: She is oriented to person, place, and time. She appears well-developed and well-nourished. No distress.  HENT:  Head: Normocephalic and atraumatic.  Right Ear: Tympanic membrane, external ear and ear canal normal.  Left Ear: Tympanic membrane, external ear and ear canal normal.  Nose:  Nose normal.  Mouth/Throat: Uvula is midline, oropharynx is clear and moist and mucous membranes are normal. No trismus in the jaw. No oropharyngeal exudate, posterior oropharyngeal erythema or tonsillar abscesses.  Eyes: EOM are normal. Pupils are equal, round, and reactive to light.  Neck: Normal range of motion. Neck supple. No tracheal deviation present.  Cardiovascular: Normal rate, regular rhythm, S1 normal, S2 normal, normal heart sounds, intact distal pulses and normal pulses.   Pulmonary/Chest: Effort  normal and breath sounds normal. No respiratory distress. She has no decreased breath sounds. She has no wheezes. She has no rhonchi. She has no rales.  Abdominal: Normal appearance and bowel sounds are normal. There is no tenderness.  Musculoskeletal: Normal range of motion.  Neurological: She is alert and oriented to person, place, and time.  Skin: Skin is warm and dry.  Psychiatric: She has a normal mood and affect. Her speech is normal and behavior is normal. Thought content normal.     ED Treatments / Results  DIAGNOSTIC STUDIES:  Oxygen Saturation is 99% on RA, normal by my interpretation.    COORDINATION OF CARE:  8:37 PM Discussed treatment plan with pt at bedside which includes Tessalon, tylenol, ibuprofen and fluids. Pt agreed to plan.  Labs (all labs ordered are listed, but only abnormal results are displayed) Labs Reviewed - No data to display  EKG  EKG Interpretation None       Radiology No results found.  Procedures Procedures (including critical care time)  Medications Ordered in ED Medications - No data to display   Initial Impression / Assessment and Plan / ED Course  I have reviewed the triage vital signs and the nursing notes.  Pertinent labs & imaging results that were available during my care of the patient were reviewed by me and considered in my medical decision making (see chart for details).  Final Clinical Impressions(s) / ED Diagnoses     {I have reviewed the relevant previous healthcare records.  {I obtained HPI from historian.   ED Course:  Assessment: Pt is a 26yF presents with URI symptoms x 3 days . On exam, pt in NAD. VSS. Afebrile. Lungs CTA, Heart RRR. Abdomen nontender/soft. Patients symptoms are consistent with URI, likely viral etiology. Discussed that antibiotics are not indicated for viral infections. Pt will be discharged with symptomatic treatment.  Verbalizes understanding and is agreeable with plan. Pt is hemodynamically  stable & in NAD prior to dc  Disposition/Plan:  DC Home Additional Verbal discharge instructions given and discussed with patient.  Pt Instructed to f/u with PCP in the next week for evaluation and treatment of symptoms. Return precautions given Pt acknowledges and agrees with plan  Supervising Physician Canary Brim Tegeler, MD  Final diagnoses:  Upper respiratory tract infection, unspecified type    New Prescriptions New Prescriptions   No medications on file       Audry Pili, PA-C 07/20/16 2041    Canary Brim Tegeler, MD 07/21/16 1153

## 2016-07-20 NOTE — ED Notes (Signed)
Patient transported to X-ray 

## 2016-07-20 NOTE — Discharge Instructions (Signed)
Please read and follow all provided instructions.  Your diagnoses today include:  1. Upper respiratory tract infection, unspecified type     You appear to have an upper respiratory infection (URI). An upper respiratory tract infection, or cold, is a viral infection of the air passages leading to the lungs. It should improve gradually after 5-7 days. You may have a lingering cough that lasts for 2- 4 weeks after the infection.  Tests performed today include: Vital signs. See below for your results today.   Medications prescribed:   Take any prescribed medications only as directed. Treatment for your infection is aimed at treating the symptoms. There are no medications, such as antibiotics, that will cure your infection.   Home care instructions:  Follow any educational materials contained in this packet.   Your illness is contagious and can be spread to others, especially during the first 3 or 4 days. It cannot be cured by antibiotics or other medicines. Take basic precautions such as washing your hands often, covering your mouth when you cough or sneeze, and avoiding public places where you could spread your illness to others.   Please continue drinking plenty of fluids.  Use over-the-counter medicines as needed as directed on packaging for symptom relief.  You may also use ibuprofen or tylenol as directed on packaging for pain or fever.  Do not take multiple medicines containing Tylenol or acetaminophen to avoid taking too much of this medication.  Follow-up instructions: Please follow-up with your primary care provider in the next 3 days for further evaluation of your symptoms if you are not feeling better.   Return instructions:  Please return to the Emergency Department if you experience worsening symptoms.  RETURN IMMEDIATELY IF you develop shortness of breath, confusion or altered mental status, a new rash, become dizzy, faint, or poorly responsive, or are unable to be cared for at  home. Please return if you have persistent vomiting and cannot keep down fluids or develop a fever that is not controlled by tylenol or motrin.   Please return if you have any other emergent concerns.  Additional Information:  Your vital signs today were: BP 146/69    Pulse 108    Temp 98.3 F (36.8 C) (Oral)    Resp 18    SpO2 99%  If your blood pressure (BP) was elevated above 135/85 this visit, please have this repeated by your doctor within one month. --------------

## 2016-07-20 NOTE — ED Triage Notes (Signed)
Pt reports cold symptoms x 3 days, dry cough, headache , sore throat and shortness of breath . No fever . Denies chest pain

## 2016-10-19 ENCOUNTER — Encounter: Payer: Self-pay | Admitting: Gynecology
